# Patient Record
Sex: Male | Born: 1940 | Race: White | Hispanic: No | Smoking: Current every day smoker
Health system: Southern US, Community
[De-identification: ages and names within clinical notes are randomized; demographics above are authoritative.]

## PROBLEM LIST (undated history)

## (undated) DIAGNOSIS — R5381 Other malaise: Secondary | ICD-10-CM

## (undated) DIAGNOSIS — R55 Syncope and collapse: Secondary | ICD-10-CM

## (undated) DIAGNOSIS — E871 Hypo-osmolality and hyponatremia: Secondary | ICD-10-CM

## (undated) DIAGNOSIS — E43 Unspecified severe protein-calorie malnutrition: Secondary | ICD-10-CM

## (undated) DIAGNOSIS — I10 Essential (primary) hypertension: Secondary | ICD-10-CM

## (undated) DIAGNOSIS — J449 Chronic obstructive pulmonary disease, unspecified: Secondary | ICD-10-CM

## (undated) DIAGNOSIS — E7800 Pure hypercholesterolemia, unspecified: Secondary | ICD-10-CM

## (undated) DIAGNOSIS — R600 Localized edema: Secondary | ICD-10-CM

## (undated) DIAGNOSIS — J9611 Chronic respiratory failure with hypoxia: Secondary | ICD-10-CM

## (undated) DIAGNOSIS — F29 Unspecified psychosis not due to a substance or known physiological condition: Secondary | ICD-10-CM

## (undated) DIAGNOSIS — K5900 Constipation, unspecified: Secondary | ICD-10-CM

## (undated) DIAGNOSIS — I719 Aortic aneurysm of unspecified site, without rupture: Secondary | ICD-10-CM

## (undated) DIAGNOSIS — E785 Hyperlipidemia, unspecified: Secondary | ICD-10-CM

## (undated) DIAGNOSIS — I959 Hypotension, unspecified: Secondary | ICD-10-CM

## (undated) DIAGNOSIS — J9620 Acute and chronic respiratory failure, unspecified whether with hypoxia or hypercapnia: Secondary | ICD-10-CM

## (undated) DIAGNOSIS — R109 Unspecified abdominal pain: Secondary | ICD-10-CM

## (undated) DIAGNOSIS — I2781 Cor pulmonale (chronic): Secondary | ICD-10-CM

## (undated) HISTORY — DX: Chronic obstructive pulmonary disease, unspecified: J44.9

## (undated) HISTORY — DX: Constipation, unspecified: K59.00

## (undated) HISTORY — DX: Hyperlipidemia, unspecified: E78.5

## (undated) HISTORY — DX: Essential (primary) hypertension: I10

## (undated) HISTORY — DX: Unspecified abdominal pain: R10.9

## (undated) HISTORY — DX: Cor pulmonale (chronic): I27.81

## (undated) HISTORY — DX: Hypo-osmolality and hyponatremia: E87.1

## (undated) HISTORY — DX: Acute and chronic respiratory failure, unspecified whether with hypoxia or hypercapnia: J96.20

## (undated) HISTORY — DX: Localized edema: R60.0

## (undated) HISTORY — DX: Other malaise: R53.81

## (undated) HISTORY — DX: Hypotension, unspecified: I95.9

## (undated) HISTORY — PX: ANGIOPLASTY: SHX39

## (undated) HISTORY — DX: Unspecified psychosis not due to a substance or known physiological condition: F29

## (undated) HISTORY — DX: Syncope and collapse: R55

## (undated) HISTORY — DX: Unspecified severe protein-calorie malnutrition: E43

---

## 2001-10-08 ENCOUNTER — Inpatient Hospital Stay (HOSPITAL_COMMUNITY): Admission: EM | Admit: 2001-10-08 | Discharge: 2001-10-10 | Payer: Self-pay | Admitting: *Deleted

## 2001-10-09 ENCOUNTER — Encounter: Payer: Self-pay | Admitting: Sports Medicine

## 2002-12-08 ENCOUNTER — Emergency Department (HOSPITAL_COMMUNITY): Admission: EM | Admit: 2002-12-08 | Discharge: 2002-12-08 | Payer: Self-pay | Admitting: Emergency Medicine

## 2003-06-09 ENCOUNTER — Emergency Department (HOSPITAL_COMMUNITY): Admission: EM | Admit: 2003-06-09 | Discharge: 2003-06-10 | Payer: Self-pay | Admitting: *Deleted

## 2003-06-10 ENCOUNTER — Encounter: Payer: Self-pay | Admitting: *Deleted

## 2012-08-20 ENCOUNTER — Ambulatory Visit (INDEPENDENT_AMBULATORY_CARE_PROVIDER_SITE_OTHER): Payer: Medicare Other | Admitting: Family Medicine

## 2012-08-20 VITALS — BP 160/97 | HR 92 | Temp 97.5°F | Resp 18 | Ht 70.0 in | Wt 159.8 lb

## 2012-08-20 DIAGNOSIS — R55 Syncope and collapse: Secondary | ICD-10-CM

## 2012-08-20 LAB — POCT CBC
Granulocyte percent: 67.2 %G (ref 37–80)
HCT, POC: 48.5 % (ref 43.5–53.7)
Hemoglobin: 14.7 g/dL (ref 14.1–18.1)
Lymph, poc: 2.3 (ref 0.6–3.4)
MCH, POC: 30.6 pg (ref 27–31.2)
MCHC: 30.3 g/dL — AB (ref 31.8–35.4)
MCV: 101 fL — AB (ref 80–97)
MID (cbc): 0.5 (ref 0–0.9)
MPV: 8.3 fL (ref 0–99.8)
POC Granulocyte: 5.7 (ref 2–6.9)
POC LYMPH PERCENT: 26.6 %L (ref 10–50)
POC MID %: 6.2 %M (ref 0–12)
Platelet Count, POC: 436 10*3/uL — AB (ref 142–424)
RBC: 4.8 M/uL (ref 4.69–6.13)
RDW, POC: 13.7 %
WBC: 8.5 10*3/uL (ref 4.6–10.2)

## 2012-08-20 LAB — GLUCOSE, POCT (MANUAL RESULT ENTRY): POC Glucose: 93 mg/dl (ref 70–99)

## 2012-08-20 NOTE — Progress Notes (Signed)
This 71 year old veteran with acute syncope about one hour prior to arrival. He's lying on his couch when he felt flushing in his face which was quite intense for he passed out. He feels that he was only out for a few seconds and when he woke up he was very jittery. In no distress at present.  There were no witness to this event.  He has not have a sore or lacerated tongue, no medications at present, no headache today, no weakness. He had a similar episode 6 years ago which was worked up over 3 days at the [facility] in no etiology was uncovered. He's a heavy smoker and has a daily cough with this. He has no chest pain, diaphoresis, nausea, vomiting, diarrhea, shortness of breath.  Patient denies any alcohol intake for over year. Is not using any other medications or drugs at this time.  Objective: Patient is alert. Patient is edentulous and shows no signs of obstructive phenomenon in the oropharynx Cranial nerves III through XII are intact Motor exam shows equal movement of all 4 extremities and no tremor Patient is alert, cooperative, unable to describe his history without difficulty Heart: Regular no murmur Neck: Supple, no adenopathy, no bruits Skin: Some actinic changes on the face otherwise unremarkable Chest: Few rhonchi bilaterally, breath sounds focal signs Abdomen: Soft nontender without HSM  EKG shows no acute changes Results for orders placed in visit on 08/20/12  POCT CBC      Component Value Range   WBC 8.5  4.6 - 10.2 K/uL   Lymph, poc 2.3  0.6 - 3.4   POC LYMPH PERCENT 26.6  10 - 50 %L   MID (cbc) 0.5  0 - 0.9   POC MID % 6.2  0 - 12 %M   POC Granulocyte 5.7  2 - 6.9   Granulocyte percent 67.2  37 - 80 %G   RBC 4.80  4.69 - 6.13 M/uL   Hemoglobin 14.7  14.1 - 18.1 g/dL   HCT, POC 16.1  09.6 - 53.7 %   MCV 101.0 (*) 80 - 97 fL   MCH, POC 30.6  27 - 31.2 pg   MCHC 30.3 (*) 31.8 - 35.4 g/dL   RDW, POC 04.5     Platelet Count, POC 436 (*) 142 - 424 K/uL   MPV  8.3  0 - 99.8 fL  GLUCOSE, POCT (MANUAL RESULT ENTRY)      Component Value Range   POC Glucose 93  70 - 99 mg/dl   Assessment: This patient's episode is most typical of a vagal syndrome. I have no reason to suspect that this brief episode we'll return any time soon, although I am concerned about not having etiology.  Plan: Cardiac referral to Dr. Newt Lukes  Discussed findings with patient and because he feels fine now I released him to be followed by cardiology

## 2012-08-20 NOTE — Patient Instructions (Addendum)
Syncope Syncope is a fainting spell. This means the person loses consciousness and drops to the ground. The person is generally unconscious for less than 5 minutes. The person may have some muscle twitches for up to 15 seconds before waking up and returning to normal. Syncope occurs more often in elderly people, but it can happen to anyone. While most causes of syncope are not dangerous, syncope can be a sign of a serious medical problem. It is important to seek medical care.  CAUSES  Syncope is caused by a sudden decrease in blood flow to the brain. The specific cause is often not determined. Factors that can trigger syncope include:  Taking medicines that lower blood pressure.  Sudden changes in posture, such as standing up suddenly.  Taking more medicine than prescribed.  Standing in one place for too long.  Seizure disorders.  Dehydration and excessive exposure to heat.  Low blood sugar (hypoglycemia).  Straining to have a bowel movement.  Heart disease, irregular heartbeat, or other circulatory problems.  Fear, emotional distress, seeing blood, or severe pain. SYMPTOMS  Right before fainting, you may:  Feel dizzy or lightheaded.  Feel nauseous.  See all white or all black in your field of vision.  Have cold, clammy skin. DIAGNOSIS  Your caregiver will ask about your symptoms, perform a physical exam, and perform electrocardiography (ECG) to record the electrical activity of your heart. Your caregiver may also perform other heart or blood tests to determine the cause of your syncope. TREATMENT  In most cases, no treatment is needed. Depending on the cause of your syncope, your caregiver may recommend changing or stopping some of your medicines. HOME CARE INSTRUCTIONS  Have someone stay with you until you feel stable.  Do not drive, operate machinery, or play sports until your caregiver says it is okay.  Keep all follow-up appointments as directed by your  caregiver.  Lie down right away if you start feeling like you might faint. Breathe deeply and steadily. Wait until all the symptoms have passed.  Drink enough fluids to keep your urine clear or pale yellow.  If you are taking blood pressure or heart medicine, get up slowly, taking several minutes to sit and then stand. This can reduce dizziness. SEEK IMMEDIATE MEDICAL CARE IF:   You have a severe headache.  You have unusual pain in the chest, abdomen, or back.  You are bleeding from the mouth or rectum, or you have black or tarry stool.  You have an irregular or very fast heartbeat.  You have pain with breathing.  You have repeated fainting or seizure-like jerking during an episode.  You faint when sitting or lying down.  You have confusion.  You have difficulty walking.  You have severe weakness.  You have vision problems. If you fainted, call your local emergency services (911 in [place] Do not drive yourself to the hospital.  MAKE SURE YOU:  Understand these instructions.  Will watch your condition.  Will get help right away if you are not doing well or get worse. Document Released: 10/03/2005 Document Revised: 04/03/2012 Document Reviewed: 12/02/2011 Exit[employer] Patient Information 2013 [employer].  

## 2012-08-29 DIAGNOSIS — I714 Abdominal aortic aneurysm, without rupture: Secondary | ICD-10-CM | POA: Insufficient documentation

## 2012-09-08 ENCOUNTER — Encounter: Payer: Self-pay | Admitting: Family Medicine

## 2012-09-09 ENCOUNTER — Encounter: Payer: Self-pay | Admitting: Family Medicine

## 2012-09-09 DIAGNOSIS — R55 Syncope and collapse: Secondary | ICD-10-CM | POA: Insufficient documentation

## 2012-09-09 DIAGNOSIS — E785 Hyperlipidemia, unspecified: Secondary | ICD-10-CM

## 2012-09-09 DIAGNOSIS — I10 Essential (primary) hypertension: Secondary | ICD-10-CM | POA: Insufficient documentation

## 2012-09-09 DIAGNOSIS — R935 Abnormal findings on diagnostic imaging of other abdominal regions, including retroperitoneum: Secondary | ICD-10-CM

## 2012-09-09 HISTORY — DX: Hyperlipidemia, unspecified: E78.5

## 2012-09-09 HISTORY — DX: Essential (primary) hypertension: I10

## 2012-09-09 HISTORY — DX: Syncope and collapse: R55

## 2012-10-08 ENCOUNTER — Encounter (HOSPITAL_COMMUNITY): Payer: Self-pay | Admitting: Pharmacy Technician

## 2012-10-23 ENCOUNTER — Ambulatory Visit (HOSPITAL_COMMUNITY)
Admission: RE | Admit: 2012-10-23 | Discharge: 2012-10-23 | Disposition: A | Discharge status: Home / Self Care | Payer: Medicare Other | Source: Ambulatory Visit | Attending: Cardiology | Admitting: Cardiology

## 2012-10-23 ENCOUNTER — Encounter (HOSPITAL_COMMUNITY)
Admission: RE | Disposition: A | Discharge status: Home / Self Care | Payer: Self-pay | Source: Ambulatory Visit | Attending: Cardiology

## 2012-10-23 DIAGNOSIS — I10 Essential (primary) hypertension: Secondary | ICD-10-CM | POA: Insufficient documentation

## 2012-10-23 DIAGNOSIS — J449 Chronic obstructive pulmonary disease, unspecified: Secondary | ICD-10-CM | POA: Insufficient documentation

## 2012-10-23 DIAGNOSIS — J4489 Other specified chronic obstructive pulmonary disease: Secondary | ICD-10-CM | POA: Insufficient documentation

## 2012-10-23 DIAGNOSIS — E785 Hyperlipidemia, unspecified: Secondary | ICD-10-CM | POA: Insufficient documentation

## 2012-10-23 DIAGNOSIS — I2510 Atherosclerotic heart disease of native coronary artery without angina pectoris: Secondary | ICD-10-CM | POA: Insufficient documentation

## 2012-10-23 DIAGNOSIS — F17200 Nicotine dependence, unspecified, uncomplicated: Secondary | ICD-10-CM | POA: Insufficient documentation

## 2012-10-23 DIAGNOSIS — R55 Syncope and collapse: Secondary | ICD-10-CM | POA: Insufficient documentation

## 2012-10-23 HISTORY — PX: ABDOMINAL ANGIOGRAM: SHX5499

## 2012-10-23 HISTORY — PX: LEFT HEART CATHETERIZATION WITH CORONARY ANGIOGRAM: SHX5451

## 2012-10-23 SURGERY — LEFT HEART CATHETERIZATION WITH CORONARY ANGIOGRAM
Anesthesia: LOCAL

## 2012-10-23 MED ORDER — ASPIRIN 81 MG PO CHEW
324.0000 mg | CHEWABLE_TABLET | ORAL | Status: AC
  Administered 2012-10-23: 324 mg via ORAL

## 2012-10-23 MED ORDER — METOPROLOL TARTRATE 25 MG PO TABS
25.0000 mg | ORAL_TABLET | ORAL | Status: DC
  Filled 2012-10-23: qty 1

## 2012-10-23 MED ORDER — MIDAZOLAM HCL 2 MG/2ML IJ SOLN
INTRAMUSCULAR | Status: AC
  Filled 2012-10-23: qty 2

## 2012-10-23 MED ORDER — SODIUM CHLORIDE 0.9 % IV SOLN: 1.0000 mL/kg/h | INTRAVENOUS | Status: DC

## 2012-10-23 MED ORDER — NITROGLYCERIN 0.2 MG/ML ON CALL CATH LAB
INTRAVENOUS | Status: AC
  Filled 2012-10-23: qty 1

## 2012-10-23 MED ORDER — SODIUM CHLORIDE 0.9 % IV SOLN
INTRAVENOUS | Status: DC
  Administered 2012-10-23: 06:00:00 via INTRAVENOUS

## 2012-10-23 MED ORDER — HEPARIN (PORCINE) IN NACL 2-0.9 UNIT/ML-% IJ SOLN
INTRAMUSCULAR | Status: AC
  Filled 2012-10-23: qty 1000

## 2012-10-23 MED ORDER — SODIUM CHLORIDE 0.9 % IV SOLN: 250.0000 mL | INTRAVENOUS | Status: DC | PRN

## 2012-10-23 MED ORDER — ACETAMINOPHEN 325 MG PO TABS: 650.0000 mg | ORAL_TABLET | ORAL | Status: DC | PRN

## 2012-10-23 MED ORDER — SODIUM CHLORIDE 0.9 % IJ SOLN: 3.0000 mL | Freq: Two times a day (BID) | INTRAMUSCULAR | Status: DC

## 2012-10-23 MED ORDER — DILTIAZEM HCL ER COATED BEADS 240 MG PO CP24
240.0000 mg | ORAL_CAPSULE | Freq: Once | ORAL | Status: DC
  Filled 2012-10-23: qty 1

## 2012-10-23 MED ORDER — ASPIRIN 81 MG PO CHEW
CHEWABLE_TABLET | ORAL | Status: AC
  Filled 2012-10-23: qty 4

## 2012-10-23 MED ORDER — HYDROMORPHONE HCL PF 2 MG/ML IJ SOLN
INTRAMUSCULAR | Status: AC
  Filled 2012-10-23: qty 1

## 2012-10-23 MED ORDER — LIDOCAINE HCL (PF) 1 % IJ SOLN
INTRAMUSCULAR | Status: AC
  Filled 2012-10-23: qty 30

## 2012-10-23 MED ORDER — VERAPAMIL HCL 2.5 MG/ML IV SOLN
INTRAVENOUS | Status: AC
  Filled 2012-10-23: qty 2

## 2012-10-23 MED ORDER — ONDANSETRON HCL 4 MG/2ML IJ SOLN: 4.0000 mg | Freq: Four times a day (QID) | INTRAMUSCULAR | Status: DC | PRN

## 2012-10-23 MED ORDER — SODIUM CHLORIDE 0.9 % IJ SOLN: 3.0000 mL | INTRAMUSCULAR | Status: DC | PRN

## 2012-10-23 MED ORDER — BIVALIRUDIN 250 MG IV SOLR
INTRAVENOUS | Status: AC
  Filled 2012-10-23: qty 250

## 2012-10-23 MED ORDER — ADENOSINE 12 MG/4ML IV SOLN
12.0000 mL | INTRAVENOUS | Status: AC
  Administered 2012-10-23: 36 mg via INTRAVENOUS
  Filled 2012-10-23: qty 12

## 2012-10-23 MED ORDER — HEPARIN SODIUM (PORCINE) 1000 UNIT/ML IJ SOLN
INTRAMUSCULAR | Status: AC
  Filled 2012-10-23: qty 1

## 2012-10-23 NOTE — Interval H&P Note (Signed)
History and Physical Interval Note:  10/23/2012 7:45 AM  Colton Owens  has presented today for surgery, with the diagnosis of abnormal stress test  The various methods of treatment have been discussed with the patient and family. After consideration of risks, benefits and other options for treatment, the patient has consented to  Procedure(s) (LRB) with comments: LEFT HEART CATHETERIZATION WITH CORONARY ANGIOGRAM (N/A) ABDOMINAL ANGIOGRAM (N/A) and possible angioplasty. Abdominal aortogram as a surgical intervention .  The patient's history has been reviewed, patient examined, no change in status, stable for surgery.  I have reviewed the patient's chart and labs.  Questions were answered to the patient's satisfaction.     [name]

## 2012-10-23 NOTE — H&P (Signed)
  Please see paper chart  

## 2012-10-23 NOTE — Interval H&P Note (Signed)
History and Physical Interval Note:  10/23/2012 7:49 AM  Colton Owens  has presented today for surgery, with the diagnosis of abnormal stress test  The various methods of treatment have been discussed with the patient and family. After consideration of risks, benefits and other options for treatment, the patient has consented to  Procedure(s) (LRB) with comments: LEFT HEART CATHETERIZATION WITH CORONARY ANGIOGRAM (N/A) ABDOMINAL ANGIOGRAM (N/A) as a surgical intervention .  The patient's history has been reviewed, patient examined, no change in status, stable for surgery.  I have reviewed the patient's chart and labs.  Questions were answered to the patient's satisfaction.     [facility] R  Ms. Dow Adolph, 4th year medical student with me and patient consents for her observation

## 2012-10-23 NOTE — CV Procedure (Addendum)
Procedure performed:  Left heart catheterization including hemodynamic monitoring of the left ventricle, LV gram, selective right and left coronary arteriography. FFR to the RCA, Abdominal and thoracic aortogram.   Indication patient is a 72 year-old male with history of hypertension, syncope, COPD and tobacco use disorder hyperlipidemia,  who presents with syncope. Patient has  had non invasive testing which was abnormal revealing inferior wall ischemia.  Hence is brought to the cardiac catheterization lab to evaluate the  coronary anatomy for definitive diagnosis of CAD. He also had abdominal aortic duplex which had revealed a 3.4 cm distal abdominal aortic aneurysm. Hence aortogram was performed to evaluate the same.   Hemodynamic data:  Left ventricular pressure was 88/5 with LVEDP of 11 mm mercury. Aortic pressure was 83/54 with a mean of 68 mm mercury. There was no pressure gradient across the aortic valve  Left ventricle: Performed in the RAO projection revealed LVEF of 60%. There was No MR. No wall motion abnormality.  Right coronary artery: The vessel is a large and a dominant vessel giving origin to large PL branch and large PDA branch. There is mild diffuse luminal irregularity distally and proximal to mid segment of the right coronary artery showed a calcific luminal irregularity, and there was a 50% stenosis noted in the proximal to mid segment. The lesion length measured at least about 20-24 mm and the distal end of the lesion appeared to have a 50-60% stenosis. AV groove branch came off from the distal right coronary artery. It is  Dominant.  Left main coronary artery is large and normal. Mild calcification is evident.  Circumflex coronary artery: A large vessel giving origin to a large obtuse marginal 1 And 2 small marginals . It is smooth except in the midsegment has mild diffuse luminal irregularity constituting 20-30% stenosis followed by a napkin ringlike 30-40% stenosis. The AV  groove branch is smooth and appear to give a small PDA branch.   LAD:  LAD gives origin to a large diagonal-1.  LAD has mild  luminal irregularities. Proximal LAD has mild calcification and has a 20-30% stenosis diffusely.   Impression: Patient has had outpatient stress testing which had revealed inferior wall ischemia. However the angiographic anatomy of the right coronary artery stenosis does not appear to be high-grade. Hence I felt it was prudent to confirm the significance of the stenosis prior to proceeding with PCI. And with this in mind I proceeded with performing FFR at site of mid RCA stenosis.  FFR baseline 0.96, FFR with take hyperemia 0.86. Hence lesion not felt to be significant.  Thoracic and abdominal aortogram: No evidence of aortic aneurysm. Mild atherosclerotic changes were evident. There were 2 renal arteries one on either sides. Is no evidence of renal artery stenosis. Aortoiliac bifurcation was widely patent. Distal abdominal aorta showed minimal ectasia.  Recommendation: Patient will need aggressive risk modification. Patient's lipids have been well controlled, he will need smoking cessation. He'll be discharged home today with outpatient followup.  Technique: Under sterile precautions using a 6 French right radial  arterial access, a 6 French sheath was introduced into the right radial artery. A 5 [place] Tig 4 catheter was advanced into the ascending aorta selective  right coronary artery and left coronary artery was cannulated and angiography was performed in multiple views. The catheter was pulled back Out of the body over exchange length J-wire.  Same Catheter was used to perform LV gram which was performed in LAO projection.  after having engage the  right coronary artery, using IV Heparin for anticoagulation, I then utilized [employer] wire to perform FFR to the intermediate RCA stenosis. The FFR at this time a 0.96 and it take hyperemia the FFR was 0.86. This lesion was  felt to be hemodynamically insignificant and left alone.  Catheter exchanged out of the body over J-Wire.  Then I utilized a pigtail catheter which was advanced into the thoracic aorta and as the aorta was tortuous, the length of the pigtail was not enough to reach abdominal aorta. I was able to still obtain adequate visualization of the entire abdominal and thoracic aorta and this revealed no evidence of abdominal aortic aneurysm or thoracic aortic aneurysm. Mild atherosclerotic changes were evident. The wire was withdrawn out of the body over the J-wire. Hemostasis was obtained by applying TR band. NO immediate complications noted. Patient tolerated the procedure well.   Rec: Medical therapy with aggressive risk factor reduction.   Disposition: Will be discharged home today with outpatient follow up.

## 2013-02-28 ENCOUNTER — Encounter (HOSPITAL_COMMUNITY): Payer: Self-pay | Admitting: *Deleted

## 2013-02-28 ENCOUNTER — Inpatient Hospital Stay (HOSPITAL_COMMUNITY)
Admission: EM | Admit: 2013-02-28 | Discharge: 2013-03-01 | DRG: 641 | Disposition: A | Discharge status: Home / Self Care | Payer: Medicare Other | Attending: Family Medicine | Admitting: Family Medicine

## 2013-02-28 ENCOUNTER — Emergency Department (HOSPITAL_COMMUNITY): Payer: Medicare Other

## 2013-02-28 DIAGNOSIS — I10 Essential (primary) hypertension: Secondary | ICD-10-CM | POA: Diagnosis present

## 2013-02-28 DIAGNOSIS — R209 Unspecified disturbances of skin sensation: Secondary | ICD-10-CM | POA: Diagnosis present

## 2013-02-28 DIAGNOSIS — E871 Hypo-osmolality and hyponatremia: Principal | ICD-10-CM | POA: Diagnosis present

## 2013-02-28 DIAGNOSIS — Z79899 Other long term (current) drug therapy: Secondary | ICD-10-CM

## 2013-02-28 DIAGNOSIS — I714 Abdominal aortic aneurysm, without rupture, unspecified: Secondary | ICD-10-CM | POA: Diagnosis present

## 2013-02-28 DIAGNOSIS — E878 Other disorders of electrolyte and fluid balance, not elsewhere classified: Secondary | ICD-10-CM | POA: Diagnosis present

## 2013-02-28 DIAGNOSIS — F17200 Nicotine dependence, unspecified, uncomplicated: Secondary | ICD-10-CM | POA: Diagnosis present

## 2013-02-28 DIAGNOSIS — R202 Paresthesia of skin: Secondary | ICD-10-CM

## 2013-02-28 DIAGNOSIS — E785 Hyperlipidemia, unspecified: Secondary | ICD-10-CM | POA: Diagnosis present

## 2013-02-28 DIAGNOSIS — R600 Localized edema: Secondary | ICD-10-CM | POA: Diagnosis present

## 2013-02-28 DIAGNOSIS — R609 Edema, unspecified: Secondary | ICD-10-CM | POA: Diagnosis present

## 2013-02-28 DIAGNOSIS — E7800 Pure hypercholesterolemia, unspecified: Secondary | ICD-10-CM | POA: Diagnosis present

## 2013-02-28 HISTORY — DX: Pure hypercholesterolemia, unspecified: E78.00

## 2013-02-28 HISTORY — DX: Essential (primary) hypertension: I10

## 2013-02-28 LAB — POCT I-STAT TROPONIN I: Troponin i, poc: 0 ng/mL (ref 0.00–0.08)

## 2013-02-28 NOTE — ED Notes (Addendum)
Bilateral feet swelling x 2 days; lt. Arm numbness and some tingling which started today. No cp, no sob. Been having chills tonight. No fevers. Negative for stroke. Non productive for a while.

## 2013-02-28 NOTE — ED Notes (Signed)
The pt returned from xray 

## 2013-02-28 NOTE — ED Notes (Signed)
The pt reports that both his feet and ankles have been swollen for 2 days and he has had some lt arm numbness.  Alert he has a productive sounding cough.  Friend at the bedside

## 2013-03-01 ENCOUNTER — Other Ambulatory Visit: Payer: Self-pay

## 2013-03-01 DIAGNOSIS — R600 Localized edema: Secondary | ICD-10-CM | POA: Diagnosis present

## 2013-03-01 DIAGNOSIS — R209 Unspecified disturbances of skin sensation: Secondary | ICD-10-CM

## 2013-03-01 DIAGNOSIS — E871 Hypo-osmolality and hyponatremia: Principal | ICD-10-CM

## 2013-03-01 HISTORY — DX: Localized edema: R60.0

## 2013-03-01 LAB — COMPREHENSIVE METABOLIC PANEL
Albumin: 3.5 g/dL (ref 3.5–5.2)
BUN: 7 mg/dL (ref 6–23)
Creatinine, Ser: 0.8 mg/dL (ref 0.50–1.35)
GFR calc Af Amer: >90 mL/min (ref >90)
Glucose, Bld: 93 mg/dL (ref 70–99)
Total Protein: 6.9 g/dL (ref 6.0–8.3)

## 2013-03-01 LAB — URINALYSIS, ROUTINE W REFLEX MICROSCOPIC
Bilirubin Urine: NEGATIVE
Hgb urine dipstick: NEGATIVE
Specific Gravity, Urine: 1.009 (ref 1.005–1.030)
Urobilinogen, UA: 0.2 mg/dL (ref 0.0–1.0)
pH: 7 (ref 5.0–8.0)

## 2013-03-01 LAB — CBC
HCT: 41.9 % (ref 39.0–52.0)
HCT: 41 % (ref 39.0–52.0)
Hemoglobin: 14.8 g/dL (ref 13.0–17.0)
Hemoglobin: 14.3 g/dL (ref 13.0–17.0)
MCH: 33.3 pg (ref 26.0–34.0)
MCH: 32.6 pg (ref 26.0–34.0)
MCHC: 35.3 g/dL (ref 30.0–36.0)
MCHC: 34.9 g/dL (ref 30.0–36.0)
MCV: 94.2 fL (ref 78.0–100.0)
RBC: 4.45 MIL/uL (ref 4.22–5.81)

## 2013-03-01 LAB — TROPONIN I: Troponin I: <0.3 ng/mL (ref <0.30)

## 2013-03-01 LAB — BASIC METABOLIC PANEL
BUN: 8 mg/dL (ref 6–23)
CO2: 29 mEq/L (ref 19–32)
Calcium: 9.1 mg/dL (ref 8.4–10.5)
Creatinine, Ser: 0.82 mg/dL (ref 0.50–1.35)
GFR calc non Af Amer: 88 mL/min — ABNORMAL LOW (ref >90)
Glucose, Bld: 92 mg/dL (ref 70–99)

## 2013-03-01 LAB — LIPID PANEL
Cholesterol: 134 mg/dL (ref 0–200)
Total CHOL/HDL Ratio: 2.2 RATIO
Triglycerides: 75 mg/dL (ref <150)
VLDL: 15 mg/dL (ref 0–40)

## 2013-03-01 LAB — ALBUMIN: Albumin: 3.8 g/dL (ref 3.5–5.2)

## 2013-03-01 LAB — OSMOLALITY: Osmolality: 271 mOsm/kg — ABNORMAL LOW (ref 275–300)

## 2013-03-01 MED ORDER — ASPIRIN EC 81 MG PO TBEC
81.0000 mg | DELAYED_RELEASE_TABLET | Freq: Every day | ORAL | Status: DC
  Administered 2013-03-01: 81 mg via ORAL
  Filled 2013-03-01: qty 1

## 2013-03-01 MED ORDER — FUROSEMIDE 10 MG/ML IJ SOLN
20.0000 mg | Freq: Once | INTRAMUSCULAR | Status: AC
  Administered 2013-03-01: 20 mg via INTRAVENOUS
  Filled 2013-03-01: qty 2

## 2013-03-01 MED ORDER — SIMVASTATIN 40 MG PO TABS
40.0000 mg | ORAL_TABLET | Freq: Every day | ORAL | Status: DC
  Filled 2013-03-01: qty 1

## 2013-03-01 MED ORDER — METOPROLOL TARTRATE 25 MG PO TABS
25.0000 mg | ORAL_TABLET | Freq: Two times a day (BID) | ORAL | Status: DC
  Administered 2013-03-01: 25 mg via ORAL
  Filled 2013-03-01 (×2): qty 1

## 2013-03-01 MED ORDER — LORATADINE 10 MG PO TABS
10.0000 mg | ORAL_TABLET | Freq: Every day | ORAL | Status: DC | PRN
  Filled 2013-03-01: qty 1

## 2013-03-01 MED ORDER — HEPARIN SODIUM (PORCINE) 5000 UNIT/ML IJ SOLN
5000.0000 [IU] | Freq: Three times a day (TID) | INTRAMUSCULAR | Status: DC
  Administered 2013-03-01 (×2): 5000 [IU] via SUBCUTANEOUS
  Filled 2013-03-01 (×5): qty 1

## 2013-03-01 MED ORDER — ATORVASTATIN CALCIUM 20 MG PO TABS: 20.0000 mg | ORAL_TABLET | Freq: Every day | ORAL | Status: DC

## 2013-03-01 MED ORDER — NICOTINE 21 MG/24HR TD PT24
21.0000 mg | MEDICATED_PATCH | Freq: Every day | TRANSDERMAL | Status: DC
  Administered 2013-03-01: 21 mg via TRANSDERMAL
  Filled 2013-03-01 (×2): qty 1

## 2013-03-01 MED ORDER — ADULT MULTIVITAMIN W/MINERALS CH
1.0000 | ORAL_TABLET | Freq: Every day | ORAL | Status: DC
  Administered 2013-03-01: 1 via ORAL
  Filled 2013-03-01: qty 1

## 2013-03-01 MED ORDER — ONDANSETRON HCL 4 MG/2ML IJ SOLN: 4.0000 mg | Freq: Four times a day (QID) | INTRAMUSCULAR | Status: DC | PRN

## 2013-03-01 MED ORDER — ACETAMINOPHEN 650 MG RE SUPP: 650.0000 mg | Freq: Four times a day (QID) | RECTAL | Status: DC | PRN

## 2013-03-01 MED ORDER — ATORVASTATIN CALCIUM 20 MG PO TABS
20.0000 mg | ORAL_TABLET | Freq: Every day | ORAL | Status: DC
  Administered 2013-03-01: 20 mg via ORAL
  Filled 2013-03-01: qty 1

## 2013-03-01 MED ORDER — POLYETHYLENE GLYCOL 3350 17 G PO PACK
17.0000 g | PACK | Freq: Every day | ORAL | Status: DC
  Filled 2013-03-01: qty 1

## 2013-03-01 MED ORDER — SENNA 8.6 MG PO TABS
1.0000 | ORAL_TABLET | Freq: Two times a day (BID) | ORAL | Status: DC
  Administered 2013-03-01: 8.6 mg via ORAL
  Filled 2013-03-01 (×2): qty 1

## 2013-03-01 MED ORDER — ONDANSETRON HCL 4 MG PO TABS: 4.0000 mg | ORAL_TABLET | Freq: Four times a day (QID) | ORAL | Status: DC | PRN

## 2013-03-01 MED ORDER — DILTIAZEM HCL ER COATED BEADS 240 MG PO CP24
240.0000 mg | ORAL_CAPSULE | Freq: Every day | ORAL | Status: DC
  Administered 2013-03-01: 240 mg via ORAL
  Filled 2013-03-01: qty 1

## 2013-03-01 MED ORDER — ACETAMINOPHEN 325 MG PO TABS: 650.0000 mg | ORAL_TABLET | Freq: Four times a day (QID) | ORAL | Status: DC | PRN

## 2013-03-01 MED ORDER — SODIUM CHLORIDE 0.9 % IJ SOLN
3.0000 mL | Freq: Two times a day (BID) | INTRAMUSCULAR | Status: DC
  Administered 2013-03-01: 3 mL via INTRAVENOUS

## 2013-03-01 NOTE — Progress Notes (Signed)
UR Completed.  Colton Owens [phone] 03/01/2013  

## 2013-03-01 NOTE — Discharge Summary (Signed)
Physician Discharge Summary  Patient ID: [name] MRN: [id number] DOB/AGE: 18-Dec-1940 72 y.o.  Admit date: 02/28/2013 Discharge date: 03/01/2013  Admission Diagnoses:  Discharge Diagnoses:  Principal Problem:   Bilateral lower extremity edema Active Problems:   Abdominal aneurysm without mention of rupture   Essential hypertension, benign   Other and unspecified hyperlipidemia   Discharged Condition: good  Hospital Course: Colton Owens is 72 y.o. Male with HTN, HLD, AAA and recent cardiac cath that presented to the ED last night with feet swelling and tingling of left arm. Patient lab resulted with hyponatremia (126) hypochloremia (90), that is unknown if acute or chronic, given no previous labs available.   Bilateral ankle swelling: Possible a result of fluid overload. Mild crackles on admission exam, but not impressive in the set of COPD. CXR (COPD-like results) and BNP (99) would not suggest overload. Swelling could be possibly medication related. XLK:GMWNU rhythm with Fusion complexes, Left axis deviation. Trop negative x2. Patient already has follow up with Dr. Nadara Eaton next week.   Hyponatremia hypochloremia: Urine spec: low, suggesting dilution. Membranes are moist. Suspect euvolemic or hypervolemic causes for his hyponatremia. Fluid restriction showed immediate results, and patient was discharged home with a Na of 130. TSH was WNL. Patient's hyponatremia likely from polydipsia, admitted to increase in water intake last 24-48 hours.  Unspecified HLD: Risk stratification labs ordered (results below) . Continued high dose statin therapy  HTN: Continued medications prescribed by Nadara Eaton H/O AAA: Found during cath procedure. Recheck 6 months.  - Moderate fusiform dilation of the abdominal aorta measuring 3cm x 3.2cm is noted in the distal aorta.   Consults: None  Significant Diagnostic Studies:  Dg Chest 2 View  03/01/2013   *RADIOLOGY REPORT*  Clinical Data: Cough, leg  swelling, chills.  CHEST - 2 VIEW  Comparison: None.  Findings: Hyperinflation, emphysematous change, and interstitial coarsening.  Apical scarring.  Increased AP diameter.  No confluent airspace opacity.  No pleural effusion or pneumothorax.  Aortic tortuosity.  Normal heart size. Mild mid thoracic vertebral body height loss is not favored to be acute.  IMPRESSION: COPD.  No acute process identified.   Original Report Authenticated By: Jearld Lesch, M.D.   Lipid Panel     Component Value Date/Time   CHOL 134 03/01/2013 0620   TRIG 75 03/01/2013 0620   HDL 62 03/01/2013 0620   CHOLHDL 2.2 03/01/2013 0620   VLDL 15 03/01/2013 0620   LDLCALC 57 03/01/2013 0620    Treatments: Water restricition  Discharge Exam: Blood pressure 121/74, pulse 79, temperature 98.2 F (36.8 C), temperature source Oral, resp. rate 20, height 5\' 11"  (1.803 m), weight 152 lb 14.4 oz (69.355 kg), SpO2 94.00%. General: NAD. Sitting up in bed. Appears older than age. Oriented x3.  HEENT: AT.Pet[place]. Bilateral eyes without drainage, icterus or injections. Bilateral nares without deformity, patent without drainage. Throat without erythema or exudates. MMM.  Cardiovascular: RRR. No murmur appreciated. No rubs.  Respiratory: Mild rhonchi throughout. Mild crackles at bases. No wheeze. Normal WOB.  Abdomen: Soft. Flat. NTND. BS present  Extremities: No erythema or tenderness. Bilateral ankle/foot swelling with pitting edema 3+. Shins trace edema only.  Skin: Dry, flaky skin. Rash/dry skin on face.  Neuro: PERLA. EOMi. No focal deficits, grossly intact.   Disposition: 01-Home or Self Care     Medication List    TAKE these medications       aspirin EC 81 MG tablet  Take 81 mg by mouth daily.  diltiazem 240 MG 24 hr capsule  Commonly known as:  CARDIZEM CD  Take 240 mg by mouth daily.     metoprolol tartrate 25 MG tablet  Commonly known as:  LOPRESSOR  Take 25 mg by mouth 2 (two) times daily.     multivitamin with  minerals Tabs  Take 1 tablet by mouth daily.     OVER THE COUNTER MEDICATION  Take 1 tablet by mouth daily as needed (allergies). OTC allergy medication     pravastatin 80 MG tablet  Commonly known as:  PRAVACHOL  Take 80 mg by mouth at bedtime.       Follow-up Information   Schedule an appointment as soon as possible for a visit with DAUB, Stan Head, MD.   Contact information:   [address] [place] [address] [phone]       Follow up with [name], MD. (Keep your scheduled appointment )    Contact information:   [address] [place] [address] [phone]      Outpatient recommendations: - Bilateral pedal edema Signed: Felix Pacini 03/01/2013, 12:07 PM

## 2013-03-01 NOTE — ED Notes (Signed)
The pt  Does not have pain anywhere at present talking to his friend at his bedside

## 2013-03-01 NOTE — Progress Notes (Addendum)
Interim progress note  Spoke to pt concerning care. Feeling much better, and states that ankle edema improving. Requesting to go home. Has appt w/ Dr. Jacinto Halim in July but states that was told he can move that up to May 27. WIll Call their office to make this change. Pt will schedule appt to be seen at PCP office next week. Expressed importance of limiting free water intake. Will repeat labs upon presentation to clinic.   Will administer Lasix IV 20mg  x1 prior to DC for additional diuresis for LE edema and hyponatremia  Shelly Flatten, MD Family Medicine PGY-2 03/01/2013, 1:27 PM [phone]

## 2013-03-01 NOTE — ED Notes (Signed)
MD at bedside. 

## 2013-03-01 NOTE — Evaluation (Signed)
Physical Therapy Evaluation Patient Details Name: Colton Owens MRN: [id number] DOB: 1941/04/11 Today's Date: 03/01/2013 Time: 0920-1010    PT Assessment / Plan / Recommendation Clinical Impression  Pts O2 Sats drop with activity.   He has very sedentary lifestyle.  he doesnt walk much.  He still smokes daily.  Pt gets SOB walking to mailbox and taking out trash (he only does this occasionally).  pt needs to have a more active lifestyle.  We talked about the necessity for pt to take 10 short walks a day to help him increse his activity level.  Pt knows that long walks (further than mailbox) are not the way for him to get stronger.  Pt verbalizes understanding and doesnt want any further PT intervention.  he says he knows what to do and it is up to him to do it.  Pt thought that taking a vitamin alone woud help him get stronger/feel better.  We talked about improtance of adding exercise and diet ideas this this to help him feel better.  Education complete.    PT Assessment  Patent does not need any further PT services    Follow Up Recommendations  No PT follow up (per pt request)    Does the patient have the potential to tolerate intense rehabilitation      Barriers to Discharge        Equipment Recommendations  None recommended by PT    Recommendations for Other Services     Frequency      Precautions / Restrictions Precautions Precautions: None   Pertinent Vitals/Pain Pt O2 at rest 94% and after walking 150 feet 90%.  No pain.      Mobility  Bed Mobility Bed Mobility: Supine to Sit Supine to Sit: 7: Independent Transfers Transfers: Sit to Stand Sit to Stand: 7: Independent Ambulation/Gait Ambulation/Gait Assistance: 7: Independent Assistive device: None Ambulation/Gait Assistance Details: Pt with step to gait pattern.  Pt looked side to side and up and down with minimal change in gait pattern.  No obvious loss of balance Gait Pattern: Within Functional Limits     Exercises     PT Diagnosis:    PT Problem List:   PT Treatment Interventions:     PT Goals    Visit Information  Last PT Received On: 03/01/13 Assistance Needed: +1    Subjective Data  Subjective: what do I need PT for? Patient Stated Goal: To get home   Prior Functioning  Home Living Lives With: Alone Available Help at Discharge: Family (daughter checks on pt occasionally) Type of Home: Mobile home Home Access: Stairs to enter (3-4 steps to get in.  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  no increased sway with eyes closed or head turns.  Pt able to lean safely.  Pt did head turns side to side and up and down walking with no observed change in gait  pattern.  End of Session PT - End of Session Activity Tolerance: Patient limited by fatigue Patient left: in bed;with call bell/phone within reach  GP     Judson Roch 03/01/2013, 10:26 AM  Ranae Palms, PT

## 2013-03-01 NOTE — Progress Notes (Signed)
Occupational Therapy Discharge Patient Details Name: Colton Owens MRN: [id number] DOB: [birth year] Today's Date: 03/01/2013 Time:  -     Patient discharged from OT services secondary to pt independent with PT for all mobility and pt states that he is not having any issues with taking care of himself. Noted that he told the PT that retirement was boring so encouraged him to maybe get a part time job or volunteer somewhere. No OT needs identified, will not eval and will sign off..  Please see latest therapy progress note for current level of functioning and progress toward goals.    Progress and discharge plan discussed with patient and/or caregiver: Patient/Caregiver agrees with plan       Evette Georges [phone] 03/01/2013, 2:23 PM

## 2013-03-01 NOTE — H&P (Signed)
Family Medicine Teaching [facility] Admission History and Physical Service Pager: [phone]  Patient name: Colton Owens  Medical record number: [id number]  Date of birth: 06-Oct-1941 Age: 72 y.o.    Primary Care Provider: Lucilla Edin, MD  Chief Complaint: Bilateral leg swelling  History of Present Illness: Colton Owens is a 72 y.o.  Male with HTN, HLD, AAA and recent cardiac cath that presented to the ED last night with 3-4 days of his  feet swelling, mostly around the ankles. He denies andy pain with this his swelling and reports he does not do anything strenuous enough to hurt himself. He also noticed yesterday his left arm started to "tingle" all the way down to the finger tips and has remained numb. He called Dr. Zada Girt office at the start of the swelling and he was given an appointment for the end of this Month. He felt he could no longer wait until then. He endorses increased in fatigue, and heart "flutter." He reports some days he just has no energy and walking to the mailbox, across the street, is difficult and gets winded. He lives alone, and his daughter comes by to visit occassionally. He denies alcohol use and states his last drink was 2-3 years ago. He denies drugs, except for antihistamine sometimes. He has tried to take vitamins to help him feel better, but he does not think they are working. He smokes daily >1 pack a day, for "many years." Also, the patient states that he drank 4 - 20 ounce bottles of water at the urging of a friend.   He denies nausea, vomit, headache, chest pain, shortness of breath, vision changes or diarrhea. Reports he eats daily, but "not well like he should." He admits to constipation.    Patient Active Problem List   Diagnosis Date Noted  . Essential hypertension, benign 09/09/2012  . Other and unspecified hyperlipidemia 09/09/2012  . Syncope and collapse 09/09/2012  . Abdominal aneurysm without mention of rupture 08/29/2012    Past  Medical History  Diagnosis Date  . Hypertension   . High cholesterol     Past Surgical History  Procedure Laterality Date  . Angioplasty      History   Social History  . Marital Status: Divorced    Spouse Name: N/A    Number of Children: N/A  . Years of Education: N/A   Occupational History  . Not on file.   Social History Main Topics  . Smoking status: Current Every Day Smoker -- 1.00 packs/day    Types: Cigarettes  . Smokeless tobacco: Not on file  . Alcohol Use: No  . Drug Use: No  . Sexually Active: Not on file   Other Topics Concern  . Not on file   Social History Narrative  . No narrative on file    No family history on file.  No Known Allergies  Review Of Systems: Per HPI  Otherwise 12 point review of systems was performed and was unremarkable.  Physical Exam: BP 149/89  Pulse 71  Temp(Src) 97.8 F (36.6 C) (Oral)  Resp 24  SpO2 96%  Exam: General: NAD. Sitting up in bed. Appears older than age. Oriented x3.  HEENT: AT.[place]. Bilateral eyes without drainage, icterus or injections. Bilateral nares without deformity, patent without drainage. Throat without erythema or exudates. MMM.  Cardiovascular: RRR. No murmur appreciated. No rubs.  Respiratory: Mild rhonchi throughout. Mild crackles at bases. No wheeze. Normal WOB.  Abdomen: Soft. Flat. NTND. BS present  Extremities: No erythema or tenderness. Bilateral ankle/foot  swelling with pitting edema 3+. Shins trace edema only.  Skin: Dry, flaky skin. Rash/dry skin on face.  Neuro: PERLA. EOMi. No focal deficits, grossly intact.  Labs and Imaging: Dg Chest 2 View  03/01/2013   *RADIOLOGY REPORT*  Clinical Data: Cough, leg swelling, chills.  CHEST - 2 VIEW  Comparison: None.  Findings: Hyperinflation, emphysematous change, and interstitial coarsening.  Apical scarring.  Increased AP diameter.  No confluent airspace opacity.  No pleural effusion or pneumothorax.  Aortic tortuosity.  Normal heart size. Mild  mid thoracic vertebral body height loss is not favored to be acute.  IMPRESSION: COPD.  No acute process identified.   Original Report Authenticated By: Jearld Lesch, M.D.    Results for orders placed during the hospital encounter of 02/28/13 (from the past 24 hour(s))  CBC     Status: None   Collection Time    02/28/13 11:11 PM      Result Value Range   WBC 10.1  4.0 - 10.5 K/uL   RBC 4.45  4.22 - 5.81 MIL/uL   Hemoglobin 14.8  13.0 - 17.0 g/dL   HCT 62.9  52.8 - 41.3 %   MCV 94.2  78.0 - 100.0 fL   MCH 33.3  26.0 - 34.0 pg   MCHC 35.3  30.0 - 36.0 g/dL   RDW 24.4  01.0 - 27.2 %   Platelets 388  150 - 400 K/uL  BASIC METABOLIC PANEL     Status: Abnormal   Collection Time    02/28/13 11:11 PM      Result Value Range   Sodium 126 (*) 135 - 145 mEq/L   Potassium 4.1  3.5 - 5.1 mEq/L   Chloride 90 (*) 96 - 112 mEq/L   CO2 29  19 - 32 mEq/L   Glucose, Bld 92  70 - 99 mg/dL   BUN 8  6 - 23 mg/dL   Creatinine, Ser 5.36  0.50 - 1.35 mg/dL   Calcium 9.1  8.4 - 64.4 mg/dL   GFR calc non Af Amer 88 (*) >90 mL/min   GFR calc Af Amer >90  >90 mL/min  PRO B NATRIURETIC PEPTIDE     Status: None   Collection Time    02/28/13 11:11 PM      Result Value Range   Pro B Natriuretic peptide (BNP) 99.6  0 - 125 pg/mL  POCT I-STAT TROPONIN I     Status: None   Collection Time    02/28/13 11:39 PM      Result Value Range   Troponin i, poc 0.00  0.00 - 0.08 ng/mL   Comment 3           ALBUMIN     Status: None   Collection Time    03/01/13  1:59 AM      Result Value Range   Albumin 3.8  3.5 - 5.2 g/dL  URINALYSIS, ROUTINE W REFLEX MICROSCOPIC     Status: None   Collection Time    03/01/13  2:31 AM      Result Value Range   Color, Urine YELLOW  YELLOW   APPearance CLEAR  CLEAR   Specific Gravity, Urine 1.009  1.005 - 1.030   pH 7.0  5.0 - 8.0   Glucose, UA NEGATIVE  NEGATIVE mg/dL   Hgb urine dipstick NEGATIVE  NEGATIVE   Bilirubin Urine NEGATIVE  NEGATIVE   Ketones, ur NEGATIVE  NEGATIVE mg/dL   Protein, ur NEGATIVE  NEGATIVE mg/dL   Urobilinogen, UA 0.2  0.0 - 1.0 mg/dL   Nitrite NEGATIVE  NEGATIVE   Leukocytes, UA NEGATIVE  NEGATIVE     Assessment and Plan: Colton Owens is 72 y.o.  Male with HTN, HLD, AAA and recent cardiac cath that presented to the ED last night with feet swelling and tingling of left arm. Patient lab resulted with hyponatremia (126) hypochloremia (90), that is unknown if acute or chronic, given no previous labs available.   Bilateral ankle swelling: Possible a result of fluid overload. Mild crackles on exam, but not impressive in the set of COPD. CXR (COPD) and BNP (99) would not suggest overload. Possibly medication related. Will  rule out CHF. ZOX:WRUEA rhythm with Fusion complexes, Left axis deviation - repeat EKG ordered for this morning - Echo ordered - Ted hose - trop negative x1, cycled ordered - Will let [facility] office know he is here in the morning.  Hyponatremia hypochloremia: Urine spec: low, suggesting dilution. Membranes are moist. Suspect euvolemic or hypervolemic causes. - TSH, serum/urine osmo, serum/urine creatinine - Fluid restrict 1L/d  Unspecified HLD: Risk stratification labs ordered.  - Continued high dose statin therapy  HTN: Continued medications prescribed by [facility] for now.   H/O AAA: Found during cath procedure. Recheck 6 months.  - Moderate fusiform dilation of the abdominal aorta measuring 3cm x 3.2cm is noted in the distal aorta.    Patient Active Problem List   Diagnosis Date Noted  . Essential hypertension, benign 09/09/2012  . Other and unspecified hyperlipidemia 09/09/2012  . Syncope and collapse 09/09/2012  . Abdominal aneurysm without mention of rupture 08/29/2012    FEN/GI: Fluid restriction (1L) heart healthy  Prophylaxis: hep sq Disposition: Pending clinic improvement and PT recs Code Status: Full  Felix Pacini DO    Family Medicine Resident PGY-1 03/01/2013, 3:41 AM  I have seen  the patient and agree with the assessment and plan.   Si Raider Clinton Sawyer, MD, MBA 03/01/2013, 7:59 AM Family Medicine Resident, PGY-2 [phone] pager   FMTS Attending Admit Note Patient seen and examined, discussed with Dr Clinton Sawyer and I agree with plan as outlined above in Dr Alan Ripper note. Briefly, a [phone] who presented with complaint of bilateral foot edema over the past 3 to 4 days; some tingling/numbness in his L hand up to around the elbow (denies pain or motor weakness/lack of coordination).  No other motor deficits, no dysarthria or visual disturbances.  His workup in the ED reveals a serum Na+=126.  He does not drink alcohol (states he has not drunk in 2 years), but admits to marked increase in water intake (says he had four 20-oz bottles of water 2 days ago-- prompted by his sister who commented that he needed to drink more water).  He does not have a known history of CHF or liver disease, and his renal function is unremarkable on admission.  He does not take diuretics for his hypertension.  On exam he is pleasant, oriented to time and place, good historian.  He has moist mucus membranes and no JVD.  Heart sounds regular; clear lung fields on my exam.  No organomegaly.  LEs with trace posterior lateral malleolar edema symmetrically but relatively unimpressive. His handgrip and gross sensation in hands/UEs is symmetric and full.  He has no facial droop or ptosis.   Assess/Plan: Patient with hyponatremia that I believe is due to polydipsia, in absence of renal or liver  disease, or CHF.  We are awaiting results of Serum Osm and Urine Osm/Na/Cr.  Almost certainly he will have low serum Osm; given the dilute nature of urine (spec grav 1.009) I suspect low Urine Osm as well. For this I would fluid restrict and monitor sodium closely.  May consider loop diuretic to increase water excretion if not improving.  No old labs in chart, but he may have had these done at Dr Verl Dicker office, therefore  would call and find out what previous chem panels show.  May want to do orthostatic BPs now as well.  Patient lives alone; PT/OT eval in light of this and his potential for falls at home (he denies prior falls to date).  Paula Compton, MD

## 2013-03-01 NOTE — Progress Notes (Signed)
All d/c instructions explained and given to pt.  Verbalized understanding.  D/c off the floor via w/c by NT. To awaiting transport.  Amanda Pea, Charity fundraiser.

## 2013-03-01 NOTE — Progress Notes (Signed)
Colton Owens 72 y.o.  Interim note:  Patient feels about the same this morning. Sodium has started to correct with fluid restriction alone. Awaiting lab work results and PT eval.

## 2013-03-01 NOTE — ED Notes (Signed)
Blanket offered not cold now.  Friend at the bedside

## 2013-03-01 NOTE — ED Provider Notes (Addendum)
History     CSN: [id number]  Arrival date & time 02/28/13  2247   First MD Initiated Contact with Patient 02/28/13 2308      Chief Complaint  Patient presents with  . Numbness  . Cough  . Leg Swelling  . Chills    (Consider location/radiation/quality/duration/timing/severity/associated sxs/prior treatment) HPI 72 year old male presents to the emergency department complaining of two-day history of bilateral foot swelling.  He denies any pain, associated with the swelling.  He has noted some pins and needle sensation to his left upper arm.  Today as well.  He denies any chest pain, no shortness of breath.  He denies previous history of congestive heart failure.  He reports he had a negative.  Heart catheterization by Dr. Jacinto Owens in January. Patient has had some cold chills, tonight, but no fevers.  He denies any other symptoms at this time.  Patient reports he has been elevating his feet since they have been swollen without improvement.  Past Medical History  Diagnosis Date  . Hypertension   . High cholesterol     Past Surgical History  Procedure Laterality Date  . Angioplasty      No family history on file.  History  Substance Use Topics  . Smoking status: Current Every Day Smoker -- 1.00 packs/day    Types: Cigarettes  . Smokeless tobacco: Not on file  . Alcohol Use: No      Review of Systems  All other systems reviewed and are negative.   Other than listed in history of present illness   Allergies  Review of patient's allergies indicates no known allergies.  Home Medications   Current Outpatient Rx  Name  Route  Sig  Dispense  Refill  . aspirin EC 81 MG tablet   Oral   Take 81 mg by mouth daily.         Marland Kitchen diltiazem (CARDIZEM CD) 240 MG 24 hr capsule   Oral   Take 240 mg by mouth daily.         . metoprolol tartrate (LOPRESSOR) 25 MG tablet   Oral   Take 25 mg by mouth 2 (two) times daily.         . Multiple Vitamin (MULTIVITAMIN WITH  MINERALS) TABS   Oral   Take 1 tablet by mouth daily.         Marland Kitchen OVER THE COUNTER MEDICATION   Oral   Take 1 tablet by mouth daily as needed (allergies). OTC allergy medication         . pravastatin (PRAVACHOL) 80 MG tablet   Oral   Take 80 mg by mouth at bedtime.           BP 152/84  Pulse 66  Temp(Src) 97.8 F (36.6 C) (Oral)  Resp 18  SpO2 98%  Physical Exam  Nursing note and vitals reviewed. Constitutional: He is oriented to person, place, and time. He appears well-developed and well-nourished. No distress.  HENT:  Head: Normocephalic and atraumatic.  Nose: Nose normal.  Mouth/Throat: Oropharynx is clear and moist.  Eyes: Conjunctivae and EOM are normal. Pupils are equal, round, and reactive to light.  Neck: Normal range of motion. Neck supple. No JVD present. No tracheal deviation present. No thyromegaly present.  Cardiovascular: Normal rate, regular rhythm, normal heart sounds and intact distal pulses.  Exam reveals no gallop and no friction rub.   No murmur heard. Pulmonary/Chest: Effort normal and breath sounds normal. No stridor. No respiratory distress. He has  no wheezes. He has no rales. He exhibits no tenderness.  Abdominal: Soft. Bowel sounds are normal. He exhibits no distension and no mass. There is no tenderness. There is no rebound and no guarding.  Musculoskeletal: Normal range of motion. He exhibits edema (2+ pitting edema to lateral feet bilaterally). He exhibits no tenderness.  Lymphadenopathy:    He has no cervical adenopathy.  Neurological: He is alert and oriented to person, place, and time. He has normal reflexes. No cranial nerve deficit. He exhibits normal muscle tone. Coordination normal.  Skin: Skin is warm and dry. No rash noted. No erythema. No pallor.  Psychiatric: He has a normal mood and affect. His behavior is normal. Judgment and thought content normal.    ED Course  Procedures (including critical care time)  Labs Reviewed  BASIC  METABOLIC PANEL - Abnormal; Notable for the following:    Sodium 126 (*)    Chloride 90 (*)    GFR calc non Af Amer 88 (*)    All other components within normal limits  CBC  PRO B NATRIURETIC PEPTIDE  URINALYSIS, ROUTINE W REFLEX MICROSCOPIC  ALBUMIN  POCT I-STAT TROPONIN I   Dg Chest 2 View  03/01/2013   *RADIOLOGY REPORT*  Clinical Data: Cough, leg swelling, chills.  CHEST - 2 VIEW  Comparison: None.  Findings: Hyperinflation, emphysematous change, and interstitial coarsening.  Apical scarring.  Increased AP diameter.  No confluent airspace opacity.  No pleural effusion or pneumothorax.  Aortic tortuosity.  Normal heart size. Mild mid thoracic vertebral body height loss is not favored to be acute.  IMPRESSION: COPD.  No acute process identified.   Original Report Authenticated By: Colton Owens, M.D.     Date: 03/01/2013  Rate: 62  Rhythm: normal sinus rhythm and premature ventricular contractions (PVC)  QRS Axis: left  Intervals: normal  ST/T Wave abnormalities: twave inversion in lead v1  Conduction Disutrbances:none  Narrative Interpretation:   Old EKG Reviewed: changes noted    1. Hyponatremia   2. Peripheral edema   3. Arm paresthesia, left       MDM  72 year old male with left arm paresthesia and bilateral lower foot, edema.  Patient well-appearing on exam.  We'll check chest x-ray, BNP, troponin, CBC.  EKG shows new flipped T wave otherwise, unremarkable.      2:09 AM Patient noted to have significant hyponatremia.  Albumin, and urine added on to prior.  Labs.  No prior sodiums to compare to.  Patient had heart catheterization done with Dr. Garth Owens. in January, I asked him that he needed.  Chemistries done, most likely in the office.  Those are not available at this time.  Given that patient lives alone, is 72 years old, and has hyponatremia, will discuss with family practice for admission, and further workup of his hyponatremia.  Patient denies drinking alcohol,  and his friend confirms this.  I suspect patient is overall hypervolemic, though not in congestive heart failure.  Colton Mackie, MD 03/01/13 1610  Colton Mackie, MD 03/01/13 [phone]

## 2013-03-01 NOTE — Progress Notes (Signed)
Nutrition Brief Note  Patient identified on the Malnutrition Screening Tool (MST) Report for unsure of any recent weight loss. Per review of usual weights in EMR, patient has not lost a significant amount of weight.  Wt Readings from Last 10 Encounters:  03/01/13 152 lb 14.4 oz (69.355 kg)  10/23/12 160 lb (72.576 kg)  10/23/12 160 lb (72.576 kg)  08/20/12 159 lb 12.8 oz (72.485 kg)     Body mass index is 21.33 kg/(m^2). Patient meets criteria for normal weight based on current BMI.   Current diet order is Heart Healthy, patient is consuming approximately 100% of meals at this time. Labs and medications reviewed.   No nutrition interventions warranted at this time. If nutrition issues arise, please consult RD.   Joaquin Courts, RD, LDN, CNSC Pager [phone] After Hours Pager [phone]

## 2013-03-07 NOTE — Discharge Summary (Signed)
Family Medicine Teaching Service  Discharge Note : Attending Jeff Walden MD Pager [phone] Inpatient Team Pager:  [phone]  I have reviewed this patient and the patient's chart and have discussed discharge planning with the resident at the time of discharge. I agree with the discharge plan as above.    

## 2013-07-15 ENCOUNTER — Encounter: Payer: Self-pay | Admitting: Family Medicine

## 2014-01-01 ENCOUNTER — Encounter: Payer: Self-pay | Admitting: Family Medicine

## 2014-09-22 ENCOUNTER — Ambulatory Visit
Admission: RE | Admit: 2014-09-22 | Discharge: 2014-09-22 | Disposition: A | Discharge status: Home / Self Care | Payer: Medicare Other | Source: Ambulatory Visit | Attending: Cardiology | Admitting: Cardiology

## 2014-09-22 ENCOUNTER — Other Ambulatory Visit: Payer: Self-pay | Admitting: Cardiology

## 2014-09-22 ENCOUNTER — Encounter (INDEPENDENT_AMBULATORY_CARE_PROVIDER_SITE_OTHER): Payer: Self-pay

## 2014-09-22 DIAGNOSIS — R0602 Shortness of breath: Secondary | ICD-10-CM

## 2014-09-25 ENCOUNTER — Encounter (HOSPITAL_COMMUNITY): Payer: Self-pay | Admitting: Cardiology

## 2014-10-27 ENCOUNTER — Institutional Professional Consult (permissible substitution): Payer: Medicare Other | Admitting: Pulmonary Disease

## 2014-11-26 ENCOUNTER — Institutional Professional Consult (permissible substitution): Payer: Medicare Other | Admitting: Pulmonary Disease

## 2014-12-26 ENCOUNTER — Encounter: Payer: Self-pay | Admitting: Gastroenterology

## 2015-01-08 ENCOUNTER — Institutional Professional Consult (permissible substitution): Payer: Medicare Other | Admitting: Pulmonary Disease

## 2015-02-16 ENCOUNTER — Ambulatory Visit: Payer: Medicare Other | Admitting: Gastroenterology

## 2015-02-28 ENCOUNTER — Inpatient Hospital Stay (HOSPITAL_COMMUNITY)
Admission: EM | Admit: 2015-02-28 | Discharge: 2015-03-01 | DRG: 641 | Disposition: A | Discharge status: Home / Self Care | Payer: Medicare Other | Attending: Internal Medicine | Admitting: Internal Medicine

## 2015-02-28 ENCOUNTER — Emergency Department (HOSPITAL_COMMUNITY): Payer: Medicare Other

## 2015-02-28 ENCOUNTER — Encounter (HOSPITAL_COMMUNITY): Payer: Self-pay

## 2015-02-28 DIAGNOSIS — I714 Abdominal aortic aneurysm, without rupture, unspecified: Secondary | ICD-10-CM

## 2015-02-28 DIAGNOSIS — E871 Hypo-osmolality and hyponatremia: Secondary | ICD-10-CM

## 2015-02-28 DIAGNOSIS — J9611 Chronic respiratory failure with hypoxia: Secondary | ICD-10-CM | POA: Diagnosis present

## 2015-02-28 DIAGNOSIS — J449 Chronic obstructive pulmonary disease, unspecified: Secondary | ICD-10-CM | POA: Diagnosis not present

## 2015-02-28 DIAGNOSIS — R10A3 Flank pain, bilateral: Secondary | ICD-10-CM

## 2015-02-28 DIAGNOSIS — E878 Other disorders of electrolyte and fluid balance, not elsewhere classified: Secondary | ICD-10-CM | POA: Diagnosis present

## 2015-02-28 DIAGNOSIS — F17210 Nicotine dependence, cigarettes, uncomplicated: Secondary | ICD-10-CM | POA: Diagnosis present

## 2015-02-28 DIAGNOSIS — R109 Unspecified abdominal pain: Secondary | ICD-10-CM

## 2015-02-28 DIAGNOSIS — R600 Localized edema: Secondary | ICD-10-CM | POA: Diagnosis present

## 2015-02-28 DIAGNOSIS — R10A0 Flank pain, unspecified side: Secondary | ICD-10-CM

## 2015-02-28 DIAGNOSIS — R5381 Other malaise: Secondary | ICD-10-CM | POA: Diagnosis present

## 2015-02-28 DIAGNOSIS — Z7982 Long term (current) use of aspirin: Secondary | ICD-10-CM | POA: Diagnosis not present

## 2015-02-28 DIAGNOSIS — I10 Essential (primary) hypertension: Secondary | ICD-10-CM | POA: Diagnosis not present

## 2015-02-28 DIAGNOSIS — R52 Pain, unspecified: Secondary | ICD-10-CM

## 2015-02-28 DIAGNOSIS — I2781 Cor pulmonale (chronic): Secondary | ICD-10-CM | POA: Diagnosis present

## 2015-02-28 DIAGNOSIS — E780 Pure hypercholesterolemia: Secondary | ICD-10-CM | POA: Diagnosis not present

## 2015-02-28 DIAGNOSIS — I7100 Dissection of unspecified site of aorta: Secondary | ICD-10-CM

## 2015-02-28 DIAGNOSIS — M549 Dorsalgia, unspecified: Secondary | ICD-10-CM | POA: Diagnosis present

## 2015-02-28 HISTORY — DX: Hypo-osmolality and hyponatremia: E87.1

## 2015-02-28 HISTORY — DX: Chronic obstructive pulmonary disease, unspecified: J44.9

## 2015-02-28 HISTORY — DX: Aortic aneurysm of unspecified site, without rupture: I71.9

## 2015-02-28 HISTORY — DX: Chronic respiratory failure with hypoxia: J96.11

## 2015-02-28 LAB — COMPREHENSIVE METABOLIC PANEL
ALBUMIN: 3.2 g/dL — AB (ref 3.5–5.0)
ALK PHOS: 61 U/L (ref 38–126)
ALT: 13 U/L — AB (ref 17–63)
ANION GAP: 9 (ref 5–15)
AST: 24 U/L (ref 15–41)
BUN: 6 mg/dL (ref 6–20)
CHLORIDE: 86 mmol/L — AB (ref 101–111)
CO2: 29 mmol/L (ref 22–32)
Calcium: 8.6 mg/dL — ABNORMAL LOW (ref 8.9–10.3)
Creatinine, Ser: 0.76 mg/dL (ref 0.61–1.24)
GFR calc Af Amer: >60 mL/min (ref >60)
GFR calc non Af Amer: >60 mL/min (ref >60)
GLUCOSE: 97 mg/dL (ref 65–99)
Potassium: 4.4 mmol/L (ref 3.5–5.1)
SODIUM: 124 mmol/L — AB (ref 135–145)
Total Bilirubin: 0.8 mg/dL (ref 0.3–1.2)
Total Protein: 6.1 g/dL — ABNORMAL LOW (ref 6.5–8.1)

## 2015-02-28 LAB — CBC WITH DIFFERENTIAL/PLATELET
BASOS PCT: 0 % (ref 0–1)
Basophils Absolute: 0 10*3/uL (ref 0.0–0.1)
EOS ABS: 0 10*3/uL (ref 0.0–0.7)
EOS PCT: 0 % (ref 0–5)
HCT: 43.3 % (ref 39.0–52.0)
HEMOGLOBIN: 14.5 g/dL (ref 13.0–17.0)
LYMPHS PCT: 12 % (ref 12–46)
Lymphs Abs: 1.1 10*3/uL (ref 0.7–4.0)
MCH: 32.4 pg (ref 26.0–34.0)
MCHC: 33.5 g/dL (ref 30.0–36.0)
MCV: 96.7 fL (ref 78.0–100.0)
Monocytes Absolute: 0.8 10*3/uL (ref 0.1–1.0)
Monocytes Relative: 8 % (ref 3–12)
NEUTROS ABS: 7.5 10*3/uL (ref 1.7–7.7)
NEUTROS PCT: 79 % — AB (ref 43–77)
Platelets: 322 10*3/uL (ref 150–400)
RBC: 4.48 MIL/uL (ref 4.22–5.81)
RDW: 11.7 % (ref 11.5–15.5)
WBC: 9.5 10*3/uL (ref 4.0–10.5)

## 2015-02-28 LAB — URINALYSIS, ROUTINE W REFLEX MICROSCOPIC
Bilirubin Urine: NEGATIVE
Glucose, UA: NEGATIVE mg/dL
Hgb urine dipstick: NEGATIVE
KETONES UR: 15 mg/dL — AB
Leukocytes, UA: NEGATIVE
Nitrite: NEGATIVE
PH: 7 (ref 5.0–8.0)
Protein, ur: NEGATIVE mg/dL
Specific Gravity, Urine: 1.011 (ref 1.005–1.030)
UROBILINOGEN UA: 0.2 mg/dL (ref 0.0–1.0)

## 2015-02-28 LAB — CBC
HCT: 42.7 % (ref 39.0–52.0)
Hemoglobin: 14.5 g/dL (ref 13.0–17.0)
MCH: 32.7 pg (ref 26.0–34.0)
MCHC: 34 g/dL (ref 30.0–36.0)
MCV: 96.4 fL (ref 78.0–100.0)
Platelets: 279 10*3/uL (ref 150–400)
RBC: 4.43 MIL/uL (ref 4.22–5.81)
RDW: 11.6 % (ref 11.5–15.5)
WBC: 7.4 10*3/uL (ref 4.0–10.5)

## 2015-02-28 LAB — TSH
TSH: 1.426 u[IU]/mL (ref 0.350–4.500)
TSH: 1.356 u[IU]/mL (ref 0.350–4.500)

## 2015-02-28 LAB — OSMOLALITY: OSMOLALITY: 265 mosm/kg — AB (ref 275–300)

## 2015-02-28 LAB — BASIC METABOLIC PANEL
ANION GAP: 10 (ref 5–15)
BUN: 5 mg/dL — AB (ref 6–20)
CHLORIDE: 91 mmol/L — AB (ref 101–111)
CO2: 26 mmol/L (ref 22–32)
Calcium: 8.3 mg/dL — ABNORMAL LOW (ref 8.9–10.3)
Creatinine, Ser: 0.71 mg/dL (ref 0.61–1.24)
GFR calc Af Amer: >60 mL/min (ref >60)
GFR calc non Af Amer: >60 mL/min (ref >60)
Glucose, Bld: 85 mg/dL (ref 65–99)
POTASSIUM: 4.3 mmol/L (ref 3.5–5.1)
Sodium: 127 mmol/L — ABNORMAL LOW (ref 135–145)

## 2015-02-28 LAB — OSMOLALITY, URINE: Osmolality, Ur: 448 mOsm/kg (ref 390–1090)

## 2015-02-28 LAB — CORTISOL: Cortisol, Plasma: 13.5 ug/dL

## 2015-02-28 LAB — I-STAT TROPONIN, ED: Troponin i, poc: 0 ng/mL (ref 0.00–0.08)

## 2015-02-28 LAB — SODIUM, URINE, RANDOM: Sodium, Ur: 142 mmol/L

## 2015-02-28 LAB — LIPASE, BLOOD: Lipase: 36 U/L (ref 22–51)

## 2015-02-28 LAB — TROPONIN I: Troponin I: <0.03 ng/mL (ref <0.031)

## 2015-02-28 MED ORDER — METHYLPREDNISOLONE SODIUM SUCC 125 MG IJ SOLR
80.0000 mg | Freq: Once | INTRAMUSCULAR | Status: AC
Start: 2015-02-28 — End: 2015-02-28
  Administered 2015-02-28: 80 mg via INTRAVENOUS
  Filled 2015-02-28: qty 2

## 2015-02-28 MED ORDER — ENSURE ENLIVE PO LIQD
237.0000 mL | Freq: Two times a day (BID) | ORAL | Status: DC
  Administered 2015-03-01: 237 mL via ORAL

## 2015-02-28 MED ORDER — METOPROLOL TARTRATE 25 MG PO TABS
25.0000 mg | ORAL_TABLET | Freq: Two times a day (BID) | ORAL | Status: DC
  Administered 2015-02-28 – 2015-03-01 (×2): 25 mg via ORAL
  Filled 2015-02-28 (×3): qty 1

## 2015-02-28 MED ORDER — CYCLOBENZAPRINE HCL 10 MG PO TABS
5.0000 mg | ORAL_TABLET | Freq: Three times a day (TID) | ORAL | Status: DC | PRN
  Administered 2015-03-01: 5 mg via ORAL
  Filled 2015-02-28: qty 1

## 2015-02-28 MED ORDER — SODIUM CHLORIDE 0.9 % IV BOLUS (SEPSIS)
500.0000 mL | Freq: Once | INTRAVENOUS | Status: AC
  Administered 2015-02-28: 500 mL via INTRAVENOUS

## 2015-02-28 MED ORDER — IPRATROPIUM BROMIDE 0.02 % IN SOLN: 0.5000 mg | Freq: Four times a day (QID) | RESPIRATORY_TRACT | Status: DC | PRN

## 2015-02-28 MED ORDER — IPRATROPIUM BROMIDE 0.02 % IN SOLN
0.5000 mg | Freq: Four times a day (QID) | RESPIRATORY_TRACT | Status: DC
  Administered 2015-02-28: 0.5 mg via RESPIRATORY_TRACT
  Filled 2015-02-28: qty 2.5

## 2015-02-28 MED ORDER — ONDANSETRON HCL 4 MG PO TABS: 4.0000 mg | ORAL_TABLET | Freq: Four times a day (QID) | ORAL | Status: DC | PRN

## 2015-02-28 MED ORDER — MORPHINE SULFATE 2 MG/ML IJ SOLN
2.0000 mg | Freq: Once | INTRAMUSCULAR | Status: AC
  Administered 2015-02-28: 2 mg via INTRAVENOUS
  Filled 2015-02-28: qty 1

## 2015-02-28 MED ORDER — MORPHINE SULFATE 4 MG/ML IJ SOLN
4.0000 mg | Freq: Once | INTRAMUSCULAR | Status: AC
  Administered 2015-02-28: 4 mg via INTRAVENOUS
  Filled 2015-02-28: qty 1

## 2015-02-28 MED ORDER — ACETAMINOPHEN 325 MG PO TABS
650.0000 mg | ORAL_TABLET | Freq: Four times a day (QID) | ORAL | Status: DC | PRN
  Administered 2015-03-01: 650 mg via ORAL
  Filled 2015-02-28: qty 2

## 2015-02-28 MED ORDER — ONDANSETRON HCL 4 MG/2ML IJ SOLN: 4.0000 mg | Freq: Four times a day (QID) | INTRAMUSCULAR | Status: DC | PRN

## 2015-02-28 MED ORDER — IOHEXOL 350 MG/ML SOLN
100.0000 mL | Freq: Once | INTRAVENOUS | Status: AC | PRN
  Administered 2015-02-28: 100 mL via INTRAVENOUS

## 2015-02-28 MED ORDER — HYDROMORPHONE HCL 1 MG/ML IJ SOLN: 1.0000 mg | INTRAMUSCULAR | Status: DC | PRN

## 2015-02-28 MED ORDER — METHYLPREDNISOLONE SODIUM SUCC 125 MG IJ SOLR
125.0000 mg | Freq: Once | INTRAMUSCULAR | Status: AC
  Administered 2015-02-28: 125 mg via INTRAVENOUS
  Filled 2015-02-28: qty 2

## 2015-02-28 MED ORDER — SODIUM CHLORIDE 0.9 % IV SOLN
INTRAVENOUS | Status: DC
  Administered 2015-02-28: 21:00:00 via INTRAVENOUS

## 2015-02-28 MED ORDER — ENOXAPARIN SODIUM 40 MG/0.4ML ~~LOC~~ SOLN
40.0000 mg | SUBCUTANEOUS | Status: DC
  Administered 2015-02-28: 40 mg via SUBCUTANEOUS
  Filled 2015-02-28 (×2): qty 0.4

## 2015-02-28 MED ORDER — PNEUMOCOCCAL VAC POLYVALENT 25 MCG/0.5ML IJ INJ
0.5000 mL | INJECTION | INTRAMUSCULAR | Status: DC
  Filled 2015-02-28: qty 0.5

## 2015-02-28 MED ORDER — ALBUTEROL SULFATE (2.5 MG/3ML) 0.083% IN NEBU
5.0000 mg | INHALATION_SOLUTION | Freq: Once | RESPIRATORY_TRACT | Status: AC
  Administered 2015-02-28: 5 mg via RESPIRATORY_TRACT
  Filled 2015-02-28: qty 6

## 2015-02-28 MED ORDER — CETYLPYRIDINIUM CHLORIDE 0.05 % MT LIQD
7.0000 mL | Freq: Two times a day (BID) | OROMUCOSAL | Status: DC
  Administered 2015-02-28 – 2015-03-01 (×2): 7 mL via OROMUCOSAL

## 2015-02-28 MED ORDER — SODIUM CHLORIDE 0.9 % IJ SOLN
3.0000 mL | Freq: Two times a day (BID) | INTRAMUSCULAR | Status: DC
  Administered 2015-02-28 – 2015-03-01 (×2): 3 mL via INTRAVENOUS

## 2015-02-28 MED ORDER — LEVALBUTEROL HCL 0.63 MG/3ML IN NEBU
0.6300 mg | INHALATION_SOLUTION | Freq: Four times a day (QID) | RESPIRATORY_TRACT | Status: DC | PRN
  Filled 2015-02-28: qty 3

## 2015-02-28 MED ORDER — ASPIRIN EC 81 MG PO TBEC
81.0000 mg | DELAYED_RELEASE_TABLET | Freq: Every day | ORAL | Status: DC
  Administered 2015-03-01: 81 mg via ORAL
  Filled 2015-02-28: qty 1

## 2015-02-28 MED ORDER — ACETAMINOPHEN 650 MG RE SUPP: 650.0000 mg | Freq: Four times a day (QID) | RECTAL | Status: DC | PRN

## 2015-02-28 MED ORDER — NICOTINE 21 MG/24HR TD PT24
21.0000 mg | MEDICATED_PATCH | Freq: Every day | TRANSDERMAL | Status: DC
  Administered 2015-02-28 – 2015-03-01 (×2): 21 mg via TRANSDERMAL
  Filled 2015-02-28 (×2): qty 1

## 2015-02-28 NOTE — ED Notes (Signed)
Patient given meal tray.

## 2015-02-28 NOTE — Progress Notes (Signed)
Report received from Dahlia ClientHannah, [place] in [place]. Awaiting pt's arrival.

## 2015-02-28 NOTE — ED Notes (Signed)
CT, [place], notified that labs resulted pt ready for CT. States pt will be next when CT that runs angio is ready.

## 2015-02-28 NOTE — ED Notes (Signed)
Patient returned from CT

## 2015-02-28 NOTE — ED Notes (Signed)
Per EMS, Yesterday night patient was standing on the back porch when his lower back started hurting. Patient took an ibuprofen and the pain did decrease, but he woke up this morning and a dull pain was still present. Patient denies any injury or trauma. Patient has HX of COPD and Aortic Aneurysm. Patient reports coughing up white sputum, but cough has not increased in the last couple of days. Patient ambulated from the stretcher and is alert and oriented x4 upon arrival. Vitals per EMS: 130/72, 80 HR, Rhonchi in all fields.

## 2015-02-28 NOTE — ED Notes (Signed)
Patient ordered diet tray.

## 2015-02-28 NOTE — H&P (Signed)
[facility] History and Physical  Colton Owens ZOX:[id number] DOB: 1941-02-26 DOA: 02/28/2015  Referring physician: ER PCP: Lucilla EdinAUB, STEVE A, MD   Chief Complaint: Back pain HPI:  74 year old male with history of AAA, hypertension, chronic hyponatremia presents with left flank pain. Patient states that he isn't coughing excessively and may have pulled a muscle. The patient has tried ibuprofen at home with no relief. The pain is nonradiating. He denies any shortness of breath or chest pain. No fever. No recent weight loss. Workup in the ED revealed hyponatremia with sodium of 124. Patient has been diagnosed with hyponatremia in the past, he drinks only 16 ounces of water a day and denies use of alcohol. Does not have any history of congestive heart failure      Review of Systems: negative for the following  Constitutional: Denies fever, chills, diaphoresis, appetite change and fatigue.  HEENT: Denies photophobia, eye pain, redness, hearing loss, ear pain, congestion, sore throat, rhinorrhea, sneezing, mouth sores, trouble swallowing, neck pain, neck stiffness and tinnitus.  Respiratory: Denies SOB, DOE, cough, chest tightness, and wheezing.  Cardiovascular: Denies chest pain, palpitations and leg swelling.  Gastrointestinal: Denies nausea, vomiting, abdominal pain, diarrhea, constipation, blood in stool and abdominal distention.  Genitourinary: Denies dysuria, urgency, frequency, hematuria, flank pain and difficulty urinating.  Musculoskeletal: Positive for back pain pain, joint swelling, arthralgias and gait problem.  Skin: Denies pallor, rash and wound.  Neurological: Denies dizziness, seizures, syncope, weakness, light-headedness, numbness and headaches.  Hematological: Denies adenopathy. Easy bruising, personal or family bleeding history  Psychiatric/Behavioral: Denies suicidal ideation, mood changes, confusion, nervousness, sleep disturbance and agitation       Past Medical  History  Diagnosis Date  . Hypertension   . High cholesterol   . Hyponatremia syndrome   . Chronic cor pulmonale   . COPD (chronic obstructive pulmonary disease)   . Aortic aneurysm      Past Surgical History  Procedure Laterality Date  . Angioplasty    . Left heart catheterization with coronary angiogram N/A 10/23/2012    Procedure: LEFT HEART CATHETERIZATION WITH CORONARY ANGIOGRAM;  Surgeon: Pamella PertJagadeesh R Ganji, MD;  [facility];  Service: Cardiovascular;  Laterality: N/A;  . Abdominal angiogram N/A 10/23/2012    Procedure: ABDOMINAL ANGIOGRAM;  Surgeon: Pamella PertJagadeesh R Ganji, MD;  [facility];  Service: Cardiovascular;  Laterality: N/A;      Social History:  reports that he has been smoking Cigarettes.  He has been smoking about 1.00 pack per day. He has never used smokeless tobacco. He reports that he does not drink alcohol or use illicit drugs.    No Known Allergies      FAMILY HISTORY  When questioned  Directly-patient reports  No family history of HTN, CVA ,DIABETES, TB, Cancer CAD, Bleeding Disorders, Sickle Cell, diabetes, anemia, asthma,   Prior to Admission medications   Medication Sig Start Date End Date Taking? Authorizing Provider  aspirin EC 81 MG tablet Take 81 mg by mouth daily.   Yes Historical Provider, MD  metoprolol tartrate (LOPRESSOR) 25 MG tablet Take 25 mg by mouth 2 (two) times daily.   Yes Yates DecampJay Ganji, MD  atorvastatin (LIPITOR) 20 MG tablet Take 1 tablet (20 mg total) by mouth daily at 6 PM. Patient not taking: Reported on 02/28/2015 03/01/13   Ozella Rocksavid J Merrell, MD     Physical Exam: Filed Vitals:   02/28/15 1756 02/28/15 1759 02/28/15 1800 02/28/15 1801  BP:   152/86   Pulse:  70 71  89  Temp:      TempSrc:      Resp: Height:      Weight:      SpO2: 96% 100%  90%     Constitutional: Vital signs reviewed. Patient disheveled and chronically ill-appearing in no acute distress and cooperative with exam. Alert and oriented  x3.  Head: Normocephalic and atraumatic  Ear: TM normal bilaterally  Mouth: no erythema or exudates, MMM  Eyes: PERRL, EOMI, conjunctivae normal, No scleral icterus.  Neck: Supple, Trachea midline normal ROM, No JVD, mass, thyromegaly, or carotid bruit present.  Cardiovascular: RRR, S1 normal, S2 normal, no MRG, pulses symmetric and intact bilaterally  Pulmonary/Chest: CTAB, no wheezes, rales, or rhonchi  Abdominal: Soft. Non-tender, non-distended, bowel sounds are normal, no masses, organomegaly, or guarding present.  GU: no CVA tenderness Musculoskeletal: No joint deformities, erythema, or stiffness, ROM full and no nontender Ext: no edema and no cyanosis, pulses palpable bilaterally (DP and PT)  Hematology: no cervical, inginal, or axillary adenopathy.  Neurological: A&O x3, Strenght is normal and symmetric bilaterally, cranial nerve II-XII are grossly intact, no focal motor deficit, sensory intact to light touch bilaterally.  Skin: Warm, dry and intact. No rash, cyanosis, or clubbing.  Psychiatric: Normal mood and affect. speech and behavior is normal. Judgment and thought content normal. Cognition and memory are normal.       Labs on Admission:    Basic Metabolic Panel:  Recent Labs Lab 02/28/15 1433  NA 124*  K 4.4  CL 86*  CO2 29  GLUCOSE 97  BUN 6  CREATININE 0.76  CALCIUM 8.6*   Liver Function Tests:  Recent Labs Lab 02/28/15 1433  AST 24  ALT 13*  ALKPHOS 61  BILITOT 0.8  PROT 6.1*  ALBUMIN 3.2*    Recent Labs Lab 02/28/15 1433  LIPASE 36   No results for input(s): AMMONIA in the last 168 hours. CBC:  Recent Labs Lab 02/28/15 1433  WBC 9.5  NEUTROABS 7.5  HGB 14.5  HCT 43.3  MCV 96.7  PLT 322   Cardiac Enzymes: No results for input(s): CKTOTAL, CKMB, CKMBINDEX, TROPONINI in the last 168 hours.  BNP (last 3 results) No results for input(s): BNP in the last 8760 hours.  ProBNP (last 3 results) No results for input(s): PROBNP in the  last 8760 hours.     CBG: No results for input(s): GLUCAP in the last 168 hours.  Radiological Exams on Admission: Ct Angio Chest Aorta W/cm &/or Wo/cm  02/28/2015   CLINICAL DATA:  Extremely short of breath and right-sided pain. Productive cough. Concern for aortic dissection  EXAM: CT ANGIOGRAPHY CHEST, ABDOMEN AND PELVIS  TECHNIQUE: Multidetector CT imaging through the chest, abdomen and pelvis was performed using the standard protocol during bolus administration of intravenous contrast. Multiplanar reconstructed images and MIPs were obtained and reviewed to evaluate the vascular anatomy.  CONTRAST:  OMNIPAQUE IOHEXOL 350 MG/ML SOLN  COMPARISON:  None.  FINDINGS: CTA CHEST FINDINGS  Mediastinum/Nodes: Non IV contrast images demonstrates no intramural hematoma within the thoracic aorta. Contrast enhanced imaging demonstrates no aortic dissection or aneurysm. The great vessels are normal.  There are no filling defects within the pulmonary arteries to suggest acute pulmonary embolism. No pericardial fluid.  No mediastinal lymphadenopathy  Lungs/Pleura: Centrilobular emphysema in the upper lobes. There is a mild peribronchial thickening and endobronchial material accumulation likely representing mucus or secretions in the right lower lobe (image 91-24 series  6). No pulmonary edema. No pneumothorax.  Review of the MIP images confirms the above findings.  CTA ABDOMEN AND PELVIS FINDINGS  Vascular: There is no acute aneurysm or dissection of the abdominal aorta. There is mild fusiform dilatation of the infrarenal abdominal aorta to 3.7 cm. There is calcified and noncalcified plaque within the lumen of the aorta. The celiac trunk and SMA are widely patent. There are bilateral patent renal arteries. No iliac artery aneurysm.  Hepatobiliary: No focal hepatic lesion. No biliary duct dilatation. Gallbladder is normal. Common bile duct is normal.  Pancreas: Pancreas is normal. No ductal dilatation. No  pancreatic inflammation.  Spleen: Normal spleen  Adrenals/urinary tract: Adrenal glands and kidneys are normal. The ureters and bladder normal.  Stomach/Bowel: Stomach, small bowel, appendix, and cecum are normal. The colon and rectosigmoid colon are normal.  Vascular/Lymphatic: Abdominal aorta is normal caliber. There is no retroperitoneal or periportal lymphadenopathy. No pelvic lymphadenopathy.  Reproductive: Prostate normal.  Musculoskeletal: No aggressive osseous lesion.  Other: No free fluid.  Review of the MIP images confirms the above findings.  IMPRESSION: Chest Impression:  1. No evidence of aortic dissection or aneurysm. 2. No pulmonary embolism. 3. Mild peribronchial thickening and endoluminal material within the right lower lobe may represent bronchitis. 4. Centrilobular emphysema in the upper lobes.  Abdomen / Pelvis Impression:  1. No Evidence of acute aneurysm or dissection of the abdominal aorta. 2. 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 Recommend followup by ultrasound in 2 years. This recommendation follows ACR consensus guidelines: White Paper of the ACR Incidental Findings Committee II on Vascular Findings. J Am Coll Radiol 2013; 10:789-794.   Electronically Signed   By: Genevive BiStewart  Edmunds M.D.   On: 02/28/2015 17:49   Ct Angio Abd/pel W/ And/or W/o  02/28/2015   CLINICAL DATA:  Extremely short of breath and right-sided pain. Productive cough. Concern for aortic dissection  EXAM: CT ANGIOGRAPHY CHEST, ABDOMEN AND PELVIS  TECHNIQUE: Multidetector CT imaging through the chest, abdomen and pelvis was performed using the standard protocol during bolus administration of intravenous contrast. Multiplanar reconstructed images and MIPs were obtained and reviewed to evaluate the vascular anatomy.  CONTRAST:  100mL OMNIPAQUE IOHEXOL 350 MG/ML SOLN  COMPARISON:  None.  FINDINGS: CTA CHEST FINDINGS  Mediastinum/Nodes: Non IV contrast images demonstrates no intramural hematoma  within the thoracic aorta. Contrast enhanced imaging demonstrates no aortic dissection or aneurysm. The great vessels are normal.  There are no filling defects within the pulmonary arteries to suggest acute pulmonary embolism. No pericardial fluid.  No mediastinal lymphadenopathy  Lungs/Pleura: Centrilobular emphysema in the upper lobes. There is a mild peribronchial thickening and endobronchial material accumulation likely representing mucus or secretions in the right lower lobe (image 91-24 series 6). No pulmonary edema. No pneumothorax.  Review of the MIP images confirms the above findings.  CTA ABDOMEN AND PELVIS FINDINGS  Vascular: There is no acute aneurysm or dissection of the abdominal aorta. There is mild fusiform dilatation of the infrarenal abdominal aorta to 3.7 cm. There is calcified and noncalcified plaque within the lumen of the aorta. The celiac trunk and SMA are widely patent. There are bilateral patent renal arteries. No iliac artery aneurysm.  Hepatobiliary: No focal hepatic lesion. No biliary duct dilatation. Gallbladder is normal. Common bile duct is normal.  Pancreas: Pancreas is normal. No ductal dilatation. No pancreatic inflammation.  Spleen: Normal spleen  Adrenals/urinary tract: Adrenal glands and kidneys are normal. The ureters and bladder normal.  Stomach/Bowel:  Stomach, small bowel, appendix, and cecum are normal. The colon and rectosigmoid colon are normal.  Vascular/Lymphatic: Abdominal aorta is normal caliber. There is no retroperitoneal or periportal lymphadenopathy. No pelvic lymphadenopathy.  Reproductive: Prostate normal.  Musculoskeletal: No aggressive osseous lesion.  Other: No free fluid.  Review of the MIP images confirms the above findings.  IMPRESSION: Chest Impression:  1. No evidence of aortic dissection or aneurysm. 2. No pulmonary embolism. 3. Mild peribronchial thickening and endoluminal material within the right lower lobe may represent bronchitis. 4. Centrilobular  emphysema in the upper lobes.  Abdomen / Pelvis Impression:  1. No Evidence of acute aneurysm or dissection of the abdominal aorta. 2. 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 Recommend followup by ultrasound in 2 years. This recommendation follows ACR consensus guidelines: White Paper of the ACR Incidental Findings Committee II on Vascular Findings. J Am Coll Radiol 2013; 10:789-794.   Electronically Signed   By: Genevive Bi M.D.   On: 02/28/2015 17:49    EKG: Independently reviewed. Assessment/Plan Active Problems:   Hyponatremia   Hypovolemic Hyponatremia , likely chronic, previous evidence of sodium in the 120s Asymptomatic Will check urine osmolality, serum osmolality, urine sodium, TSH, cortisol Home medications reviewed and the patient is not on anything that would drop his sodium No recent illnesses no diarrhea reported, denies use of alcohol quit drinking 5 years ago Patient appears to be mildly dehydrated therefore we'll start the patient on saline IV Serial BMP,  Flank pain No UA, no obvious intra-abdominal pathology Likely musculoskeletal Will start the patient on Flexeril, give 1 dose of IV Solu-Medrol When necessary Dilaudid  Hypertension continue metoprolol  Code Status:   full Family Communication: bedside Disposition Plan: admit   Time spent: 70 mins   [facility] Pager [phone]  If 7PM-7AM, please contact night-coverage [email or web address] Password [facility] 02/28/2015, 6:41 PM

## 2015-02-28 NOTE — ED Notes (Signed)
Admitting MD at the bedside.  

## 2015-02-28 NOTE — ED Provider Notes (Addendum)
CSN: [id number]     Arrival date & time 02/28/15  1311 History   First MD Initiated Contact with Patient 02/28/15 1328     Chief Complaint  Patient presents with  . Back Pain     (Consider location/radiation/quality/duration/timing/severity/associated sxs/prior Treatment) HPI 74 year old male history of AAA who presents today complaining of bilateral flank pain. He states it began last night after dinner. He states that he took an ibuprofen in the early morning hours. It has been continuous aching type pain. It is moderate to severe. He denies that he has fallen or injured himself in any way. He is dyspneic but it is at baseline for him. He is a smoker and continues to smoke. He received ports his sputum production at baseline. He reports no other increasing or decreasing factors for the pain. Reports no radiation of the pain. He has had weight loss over the past several months with decreased by mouth intake. He states he is just not had a good appetite. He has not noted any rectal bleeding. Past Medical History  Diagnosis Date  . Hypertension   . High cholesterol   . Hyponatremia syndrome   . Chronic cor pulmonale   . COPD (chronic obstructive pulmonary disease)   . Aortic aneurysm    Past Surgical History  Procedure Laterality Date  . Angioplasty    . Left heart catheterization with coronary angiogram N/A 10/23/2012    Procedure: LEFT HEART CATHETERIZATION WITH CORONARY ANGIOGRAM;  Surgeon: Pamella PertJagadeesh R Ganji, MD;  [facility];  Service: Cardiovascular;  Laterality: N/A;  . Abdominal angiogram N/A 10/23/2012    Procedure: ABDOMINAL ANGIOGRAM;  Surgeon: Pamella PertJagadeesh R Ganji, MD;  [facility];  Service: Cardiovascular;  Laterality: N/A;   No family history on file. History  Substance Use Topics  . Smoking status: Current Every Day Smoker -- 1.00 packs/day    Types: Cigarettes  . Smokeless tobacco: Never Used  . Alcohol Use: No    Review of Systems  Constitutional:  Positive for activity change and unexpected weight change.  Eyes: Negative.   Respiratory: Positive for shortness of breath.   Cardiovascular: Negative for chest pain.  Gastrointestinal: Negative.   Genitourinary: Negative.   Musculoskeletal: Positive for back pain.  Allergic/Immunologic: Negative.   Neurological: Negative.   Hematological: Negative.   Psychiatric/Behavioral: Negative.   All other systems reviewed and are negative.     Allergies  Review of patient's allergies indicates no known allergies.  Home Medications   Prior to Admission medications   Medication Sig Start Date End Date Taking? Authorizing Provider  aspirin EC 81 MG tablet Take 81 mg by mouth daily.    Historical Provider, MD  atorvastatin (LIPITOR) 20 MG tablet Take 1 tablet (20 mg total) by mouth daily at 6 PM. 03/01/13   Ozella Rocksavid J Merrell, MD  diltiazem (CARDIZEM CD) 240 MG 24 hr capsule Take 240 mg by mouth daily.    Yates DecampJay Ganji, MD  metoprolol tartrate (LOPRESSOR) 25 MG tablet Take 25 mg by mouth 2 (two) times daily.    Yates DecampJay Ganji, MD  Multiple Vitamin (MULTIVITAMIN WITH MINERALS) TABS Take 1 tablet by mouth daily.    Historical Provider, MD  OVER THE COUNTER MEDICATION Take 1 tablet by mouth daily as needed (allergies). OTC allergy medication    Historical Provider, MD   BP 167/90 mmHg  Pulse 84  Temp(Src) 98.1 F (36.7 C) (Oral)  Resp 16  Ht 5\' 11"  (1.803 m)  Wt 138 lb  11.2 oz (62.914 kg)  BMI 19.35 kg/m2  SpO2 96% Physical Exam  Constitutional: He is oriented to person, place, and time. He appears well-developed.  Early, chronically ill appearing, mildly cachectic male no acute distress  HENT:  Head: Normocephalic and atraumatic.  Right Ear: External ear normal.  Nose: Nose normal.  Mouth/Throat: Oropharynx is clear and moist.  Eyes: Pupils are equal, round, and reactive to light.  Neck: Normal range of motion. Neck supple.  Cardiovascular: Normal rate and regular rhythm.   Pulmonary/Chest:   Decreased breath sounds throughout without rhonchi or wheezing.  Abdominal: Soft. Bowel sounds are normal. He exhibits no distension. There is no tenderness. There is no rebound and no guarding.  Musculoskeletal: Normal range of motion. He exhibits edema.  No visual abnormalities of back, no palpable tenderness or swelling.  Neurological: He is alert and oriented to person, place, and time. He has normal reflexes. He displays normal reflexes. No cranial nerve deficit. Coordination normal.  Skin: Skin is warm and dry.  Psychiatric: He has a normal mood and affect.  Nursing note and vitals reviewed.   ED Course  Procedures (including critical care time) Labs Review Labs Reviewed  CBC WITH DIFFERENTIAL/PLATELET - Abnormal; Notable for the following:    Neutrophils Relative % 79 (*)    All other components within normal limits  COMPREHENSIVE METABOLIC PANEL - Abnormal; Notable for the following:    Sodium 124 (*)    Chloride 86 (*)    Calcium 8.6 (*)    Total Protein 6.1 (*)    Albumin 3.2 (*)    ALT 13 (*)    All other components within normal limits  URINALYSIS, ROUTINE W REFLEX MICROSCOPIC - Abnormal; Notable for the following:    Ketones, ur 15 (*)    All other components within normal limits  LIPASE, BLOOD  I-STAT TROPOININ, ED    Imaging Review No results found.   EKG Interpretation   Date/Time:  Saturday Feb 28 2015 14:12:44 EDT Ventricular Rate:  71 PR Interval:  121 QRS Duration: 72 QT Interval:  393 QTC Calculation: 427 R Axis:   -69 Text Interpretation:  Normal sinus rhythm Left anterior fasicular block  peaked t waves Confirmed by Taliya Mcclard MD, Duwayne HeckANIELLE ([id number]) on 02/28/2015 3:01:27  PM      MDM   Final diagnoses:  Flank pain  Pain    Patient with bilateral flank pain.  He is elderly and lives alone.  His labs here are consistent with mild hyponatremia which patient had previously and was corrected with fluid restriction and resolved.   Patient feels  improved after pain meds.  CT pending.    1-flank pain-  2- hypochloremic hyponatremia 3-copd dyspnea- patient with some increased dyspnea from baseline but denies changes in sputum production.  Solumedrol and albuterol given here.  Sats 92-96%.    Margarita Grizzleanielle Priyansh Pry, MD 02/28/15 1723 Discussed with Dr. Denton LankSteinl and he will arrange admission.   Margarita Grizzleanielle Walker [place], MD 02/28/15 [phone]

## 2015-02-28 NOTE — ED Notes (Signed)
Called Lab to follow up with blood work. Waiting for results so patient can go to CT.

## 2015-02-28 NOTE — ED Notes (Signed)
MD Ray at the bedside. 

## 2015-02-28 NOTE — Progress Notes (Signed)
NURSING PROGRESS NOTE  Lura Emarl W [place] [id number] Admission Data: 02/28/2015 9:32 PM Attending Provider: Richarda OverlieNayana Abrol, MD OZD:GUYQPCP:DAUB, Stan HeadSTEVE A, MD Code Status: Full  Lura Emarl W Bielicki is a 74 y.o. male patient admitted from ED:  -No acute distress noted.  -No complaints of shortness of breath.  -No complaints of chest pain.   Cardiac Monitoring: Box # 11 in place. Cardiac monitor yields:NSR  Blood pressure 160/83, pulse 83, temperature 98.3 F (36.8 C), temperature source Oral, resp. rate 20, height 5' 1.32" (1.558 m), weight 61.2 kg (134 lb 14.7 oz), SpO2 95 %.   IV Fluids:  IV in place, occlusive dsg intact without redness, IV cath intact in lt hand and Rt AC Allergies:  Review of patient's allergies indicates no known allergies.  Past Medical History:   has a past medical history of Hypertension; High cholesterol; Hyponatremia syndrome; Chronic cor pulmonale; COPD (chronic obstructive pulmonary disease); and Aortic aneurysm.  Past Surgical History:   has past surgical history that includes Angioplasty; left heart catheterization with coronary angiogram (N/A, 10/23/2012); and abdominal angiogram (N/A, 10/23/2012).  Social History:   reports that he has been smoking Cigarettes.  He has a 58 pack-year smoking history. He has never used smokeless tobacco. He reports that he does not drink alcohol or use illicit drugs.  Skin: Intact  Patient/Family orientated to room. Information packet given to patient/family. Admission inpatient armband information verified with patient/family to include name and date of birth and placed on patient arm. Side rails up x 2, fall assessment and education completed with patient/family. Patient/family able to verbalize understanding of risk associated with falls and verbalized understanding to call for assistance before getting out of bed. Call light within reach. Patient/family able to voice and demonstrate understanding of unit orientation instructions.

## 2015-02-28 NOTE — ED Notes (Signed)
Patient being transported to floor by Chastity, EMT.

## 2015-03-01 ENCOUNTER — Encounter (HOSPITAL_COMMUNITY): Payer: Self-pay | Admitting: Internal Medicine

## 2015-03-01 DIAGNOSIS — J449 Chronic obstructive pulmonary disease, unspecified: Secondary | ICD-10-CM | POA: Diagnosis present

## 2015-03-01 DIAGNOSIS — R109 Unspecified abdominal pain: Secondary | ICD-10-CM | POA: Diagnosis present

## 2015-03-01 DIAGNOSIS — E871 Hypo-osmolality and hyponatremia: Principal | ICD-10-CM

## 2015-03-01 DIAGNOSIS — R10A3 Flank pain, bilateral: Secondary | ICD-10-CM | POA: Diagnosis present

## 2015-03-01 DIAGNOSIS — M549 Dorsalgia, unspecified: Secondary | ICD-10-CM | POA: Diagnosis not present

## 2015-03-01 DIAGNOSIS — R5381 Other malaise: Secondary | ICD-10-CM

## 2015-03-01 DIAGNOSIS — J9611 Chronic respiratory failure with hypoxia: Secondary | ICD-10-CM

## 2015-03-01 HISTORY — DX: Other malaise: R53.81

## 2015-03-01 HISTORY — DX: Chronic respiratory failure with hypoxia: J96.11

## 2015-03-01 LAB — BASIC METABOLIC PANEL
ANION GAP: 10 (ref 5–15)
Anion gap: 7 (ref 5–15)
BUN: 6 mg/dL (ref 6–20)
BUN: <5 mg/dL — ABNORMAL LOW (ref 6–20)
CALCIUM: 8 mg/dL — AB (ref 8.9–10.3)
CALCIUM: 8.5 mg/dL — AB (ref 8.9–10.3)
CO2: 30 mmol/L (ref 22–32)
CO2: 27 mmol/L (ref 22–32)
CREATININE: 0.7 mg/dL (ref 0.61–1.24)
Chloride: 91 mmol/L — ABNORMAL LOW (ref 101–111)
Chloride: 92 mmol/L — ABNORMAL LOW (ref 101–111)
Creatinine, Ser: 0.67 mg/dL (ref 0.61–1.24)
GFR calc Af Amer: >60 mL/min (ref >60)
GFR calc non Af Amer: >60 mL/min (ref >60)
Glucose, Bld: 188 mg/dL — ABNORMAL HIGH (ref 65–99)
Glucose, Bld: 132 mg/dL — ABNORMAL HIGH (ref 65–99)
POTASSIUM: 4 mmol/L (ref 3.5–5.1)
Potassium: 4.3 mmol/L (ref 3.5–5.1)
Sodium: 128 mmol/L — ABNORMAL LOW (ref 135–145)
Sodium: 129 mmol/L — ABNORMAL LOW (ref 135–145)

## 2015-03-01 LAB — COMPREHENSIVE METABOLIC PANEL
ALBUMIN: 2.7 g/dL — AB (ref 3.5–5.0)
ALT: 11 U/L — ABNORMAL LOW (ref 17–63)
AST: 21 U/L (ref 15–41)
Alkaline Phosphatase: 54 U/L (ref 38–126)
Anion gap: 8 (ref 5–15)
BUN: 5 mg/dL — AB (ref 6–20)
CO2: 29 mmol/L (ref 22–32)
Calcium: 8.5 mg/dL — ABNORMAL LOW (ref 8.9–10.3)
Chloride: 92 mmol/L — ABNORMAL LOW (ref 101–111)
Creatinine, Ser: 0.69 mg/dL (ref 0.61–1.24)
GFR calc Af Amer: >60 mL/min (ref >60)
GFR calc non Af Amer: >60 mL/min (ref >60)
Glucose, Bld: 133 mg/dL — ABNORMAL HIGH (ref 65–99)
Potassium: 4.3 mmol/L (ref 3.5–5.1)
SODIUM: 129 mmol/L — AB (ref 135–145)
Total Bilirubin: 0.6 mg/dL (ref 0.3–1.2)
Total Protein: 5.7 g/dL — ABNORMAL LOW (ref 6.5–8.1)

## 2015-03-01 LAB — CBC
HCT: 40 % (ref 39.0–52.0)
Hemoglobin: 13.1 g/dL (ref 13.0–17.0)
MCH: 31.6 pg (ref 26.0–34.0)
MCHC: 32.8 g/dL (ref 30.0–36.0)
MCV: 96.6 fL (ref 78.0–100.0)
PLATELETS: 306 10*3/uL (ref 150–400)
RBC: 4.14 MIL/uL — ABNORMAL LOW (ref 4.22–5.81)
RDW: 11.7 % (ref 11.5–15.5)
WBC: 3.3 10*3/uL — ABNORMAL LOW (ref 4.0–10.5)

## 2015-03-01 LAB — TROPONIN I

## 2015-03-01 MED ORDER — ACETAMINOPHEN 325 MG PO TABS: 650.0000 mg | ORAL_TABLET | Freq: Four times a day (QID) | ORAL | Status: DC | PRN

## 2015-03-01 MED ORDER — CYCLOBENZAPRINE HCL 5 MG PO TABS: 5.0000 mg | ORAL_TABLET | Freq: Three times a day (TID) | ORAL | Status: DC | PRN

## 2015-03-01 NOTE — Care Management Note (Addendum)
Case Management Note  Patient Details  Name: Colton Owens MRN: [id number] Date of Birth: Mar 13, 1941  Subjective/Objective:                   Chief Complaint: Back pain Action/Plan:  Discharge plan Expected Discharge Date:                  Expected Discharge Plan:  [place]  In-House Referral:     Discharge planning Services  CM Consult  Post Acute Care Choice:  Home Health Choice offered to:  Patient  DME Arranged:    DME Agency:  [place] Arranged:  PT, OT, RN, Nurse's Aide [place] Agency:  [place]  Status of Service:  Completed, signed off  Medicare Important Message Given:    Date Medicare IM Given:    Medicare IM give by:    Date Additional Medicare IM Given:    Additional Medicare Important Message give by:     If discussed at [place] of Stay Meetings, dates discussed:    Additional Comments: 14:00 Cm received text to please arrange home 02.  RN to place Pulmonary Saturation note; CM called [facility] DME rep, james to please deliver O2 tank to room prior to discharge.  No other CM needs were communicated.   CM met with pt in room to offer choice of home health agency.  Pt chooses [facility] to render HHPT/OT/nurse/aide.  Address and contact information verified by pt.  Referral called to [facility] rep, Tiffany.  NO other CM needs were communicated.   Dellie Catholic, RN 03/01/2015, 10:46 AM

## 2015-03-01 NOTE — Progress Notes (Signed)
Per PT patient O2 sat was 86% while ambulating with no oxygen.

## 2015-03-01 NOTE — Progress Notes (Signed)
SATURATION QUALIFICATIONS: (This note is used to comply with regulatory documentation for home oxygen)  Patient Saturations on Room Air at Rest = 88 %  Patient Saturations on Room Air while Ambulating = 86 %   Patient Saturations on 2 Liters of oxygen while Ambulating = 98 %  Please briefly explain why patient needs home oxygen: 

## 2015-03-01 NOTE — Progress Notes (Signed)
Patient discharge teaching given, including activity, diet, follow-up appoints, and medications. Patient verbalized understanding of all discharge instructions. IV access was d/c'd. Vitals are stable. Skin is intact except as charted in most recent assessments. Pt to be escorted out by NT, to be driven home by family. 

## 2015-03-01 NOTE — Discharge Summary (Signed)
Physician Discharge Summary  Lura Emarl W Stumph GEX:[id number] DOB: 06/08/1941 DOA: 02/28/2015  PCP: Lucilla EdinAUB, STEVE A, MD  Admit date: 02/28/2015 Discharge date: 03/01/2015  Time spent: greater than 30 minutes  Recommendations for Outpatient Follow-up:  1. Monitor sodium 2. Home pt, rn, aide oxygen 3. Home oxygen arranged  Discharge Diagnoses:  Principal Problem:   Hyponatremia Active Problems:   Essential hypertension, benign   Bilateral lower extremity edema   Bilateral flank pain   Chronic respiratory failure with hypoxia   COPD (chronic obstructive pulmonary disease)   Debility  Discharge Condition: stable  Diet recommendation: general  Filed Weights   02/28/15 1315 02/28/15 2038  Weight: 62.914 kg (138 lb 11.2 oz) 61.2 kg (134 lb 14.7 oz)    History of present illness:  74 year old male with history of AAA, hypertension, copd, chronic hyponatremia presents with left flank pain. Patient states that he isn't coughing excessively and may have pulled a muscle. The patient has tried ibuprofen at home with no relief. The pain is nonradiating. He denies any shortness of breath or chest pain. No fever. No recent weight loss. Workup in the ED revealed hyponatremia with sodium of 124. Patient has been diagnosed with hyponatremia in the past, he drinks only 16 ounces of water a day and denies use of alcohol. Does not have any history of congestive heart failure  Hospital Course:  Felt to be slightly volume depleted on admission. Started on hydration with improvement of sodium to 129.  Back pain muscular spasm better with flexeril.  UA negative. Patient back to clinical baseline. I walked with him in the halls. He is slightly off balance, but reports this is chronic.  sats dropped to 84 with ambulation. Lungs clear and denies dyspnea. Suspect chronic hypoxic respiratory failure from COPD. No wheeze of cough to suggest acute component. Home oxygen arranged.  Worked with PT who felt patient would  benefit from SNF.  Walked 150 feet. Patient declines.  He is at clinical baseline and does need continued hospitalization.  Procedures:  none  Consultations:  none  Discharge Exam: Filed Vitals:   03/01/15 0934  BP: 130/64  Pulse: 62  Temp:   Resp:     General: elderly. Frail. A and o. We walked in the hall. Slightly unsteady, but did not require assistance Cardiovascular: RRR Respiratory: CTA Ext ankle edema  Discharge Instructions   Discharge Instructions    Diet general    Complete by:  As directed      Increase activity slowly    Complete by:  As directed           Current Discharge Medication List    START taking these medications   Details  acetaminophen (TYLENOL) 325 MG tablet Take 2 tablets (650 mg total) by mouth every 6 (six) hours as needed for mild pain (or Fever >/= 101).    cyclobenzaprine (FLEXERIL) 5 MG tablet Take 1 tablet (5 mg total) by mouth 3 (three) times daily as needed for muscle spasms. Qty: 15 tablet, Refills: 0      CONTINUE these medications which have NOT CHANGED   Details  aspirin EC 81 MG tablet Take 81 mg by mouth daily.    metoprolol tartrate (LOPRESSOR) 25 MG tablet Take 25 mg by mouth 2 (two) times daily.      STOP taking these medications     atorvastatin (LIPITOR) 20 MG tablet        No Known Allergies Follow-up Information    Follow up  with DAUB, STEVE A, MD. Schedule an appointment as soon as possible for a visit in 2 weeks.   Specialty:  Family Medicine   Contact information:   [address] [place] [id number] [phone]        The results of significant diagnostics from this hospitalization (including imaging, microbiology, ancillary and laboratory) are listed below for reference.    Significant Diagnostic Studies: Ct Angio Chest Aorta W/cm &/or Wo/cm  02/28/2015   CLINICAL DATA:  Extremely short of breath and right-sided pain. Productive cough. Concern for aortic dissection  EXAM: CT ANGIOGRAPHY  CHEST, ABDOMEN AND PELVIS  TECHNIQUE: Multidetector CT imaging through the chest, abdomen and pelvis was performed using the standard protocol during bolus administration of intravenous contrast. Multiplanar reconstructed images and MIPs were obtained and reviewed to evaluate the vascular anatomy.  CONTRAST:  100mL OMNIPAQUE IOHEXOL 350 MG/ML SOLN  COMPARISON:  None.  FINDINGS: CTA CHEST FINDINGS  Mediastinum/Nodes: Non IV contrast images demonstrates no intramural hematoma within the thoracic aorta. Contrast enhanced imaging demonstrates no aortic dissection or aneurysm. The great vessels are normal.  There are no filling defects within the pulmonary arteries to suggest acute pulmonary embolism. No pericardial fluid.  No mediastinal lymphadenopathy  Lungs/Pleura: Centrilobular emphysema in the upper lobes. There is a mild peribronchial thickening and endobronchial material accumulation likely representing mucus or secretions in the right lower lobe (image [id number] series 6). No pulmonary edema. No pneumothorax.  Review of the MIP images confirms the above findings.  CTA ABDOMEN AND PELVIS FINDINGS  Vascular: There is no acute aneurysm or dissection of the abdominal aorta. There is mild fusiform dilatation of the infrarenal abdominal aorta to 3.7 cm. There is calcified and noncalcified plaque within the lumen of the aorta. The celiac trunk and SMA are widely patent. There are bilateral patent renal arteries. No iliac artery aneurysm.  Hepatobiliary: No focal hepatic lesion. No biliary duct dilatation. Gallbladder is normal. Common bile duct is normal.  Pancreas: Pancreas is normal. No ductal dilatation. No pancreatic inflammation.  Spleen: Normal spleen  Adrenals/urinary tract: Adrenal glands and kidneys are normal. The ureters and bladder normal.  Stomach/Bowel: Stomach, small bowel, appendix, and cecum are normal. The colon and rectosigmoid colon are normal.  Vascular/Lymphatic: Abdominal aorta is normal caliber.  There is no retroperitoneal or periportal lymphadenopathy. No pelvic lymphadenopathy.  Reproductive: Prostate normal.  Musculoskeletal: No aggressive osseous lesion.  Other: No free fluid.  Review of the MIP images confirms the above findings.  IMPRESSION: Chest Impression:  1. No evidence of aortic dissection or aneurysm. 2. No pulmonary embolism. 3. Mild peribronchial thickening and endoluminal material within the right lower lobe may represent bronchitis. 4. Centrilobular emphysema in the upper lobes.  Abdomen / Pelvis Impression:  1. No Evidence of acute aneurysm or dissection of the abdominal aorta. 2. 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 Recommend followup by ultrasound in 2 years. This recommendation follows ACR consensus guidelines: White Paper of the ACR Incidental Findings Committee II on Vascular Findings. J Am Coll Radiol 2013; 10:789-794.   Electronically Signed   By: Genevive BiStewart  Edmunds M.D.   On: 02/28/2015 17:49   Ct Angio Abd/pel W/ And/or W/o  02/28/2015   CLINICAL DATA:  Extremely short of breath and right-sided pain. Productive cough. Concern for aortic dissection  EXAM: CT ANGIOGRAPHY CHEST, ABDOMEN AND PELVIS  TECHNIQUE: Multidetector CT imaging through the chest, abdomen and pelvis was performed using the standard protocol during bolus administration of intravenous contrast.  Multiplanar reconstructed images and MIPs were obtained and reviewed to evaluate the vascular anatomy.  CONTRAST:  OMNIPAQUE IOHEXOL 350 MG/ML SOLN  COMPARISON:  None.  FINDINGS: CTA CHEST FINDINGS  Mediastinum/Nodes: Non IV contrast images demonstrates no intramural hematoma within the thoracic aorta. Contrast enhanced imaging demonstrates no aortic dissection or aneurysm. The great vessels are normal.  There are no filling defects within the pulmonary arteries to suggest acute pulmonary embolism. No pericardial fluid.  No mediastinal lymphadenopathy  Lungs/Pleura: Centrilobular emphysema  in the upper lobes. There is a mild peribronchial thickening and endobronchial material accumulation likely representing mucus or secretions in the right lower lobe (image 91-24 series 6). No pulmonary edema. No pneumothorax.  Review of the MIP images confirms the above findings.  CTA ABDOMEN AND PELVIS FINDINGS  Vascular: There is no acute aneurysm or dissection of the abdominal aorta. There is mild fusiform dilatation of the infrarenal abdominal aorta to 3.7 cm. There is calcified and noncalcified plaque within the lumen of the aorta. The celiac trunk and SMA are widely patent. There are bilateral patent renal arteries. No iliac artery aneurysm.  Hepatobiliary: No focal hepatic lesion. No biliary duct dilatation. Gallbladder is normal. Common bile duct is normal.  Pancreas: Pancreas is normal. No ductal dilatation. No pancreatic inflammation.  Spleen: Normal spleen  Adrenals/urinary tract: Adrenal glands and kidneys are normal. The ureters and bladder normal.  Stomach/Bowel: Stomach, small bowel, appendix, and cecum are normal. The colon and rectosigmoid colon are normal.  Vascular/Lymphatic: Abdominal aorta is normal caliber. There is no retroperitoneal or periportal lymphadenopathy. No pelvic lymphadenopathy.  Reproductive: Prostate normal.  Musculoskeletal: No aggressive osseous lesion.  Other: No free fluid.  Review of the MIP images confirms the above findings.  IMPRESSION: Chest Impression:  1. No evidence of aortic dissection or aneurysm. 2. No pulmonary embolism. 3. Mild peribronchial thickening and endoluminal material within the right lower lobe may represent bronchitis. 4. Centrilobular emphysema in the upper lobes.  Abdomen / Pelvis Impression:  1. No Evidence of acute aneurysm or dissection of the abdominal aorta. 2. 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 Recommend followup by ultrasound in 2 years. This recommendation follows ACR consensus guidelines: White Paper of the ACR  Incidental Findings Committee II on Vascular Findings. J Am Coll Radiol 2013; 10:789-794.   Electronically Signed   By: Genevive Bi M.D.   On: 02/28/2015 17:49    Microbiology: No results found for this or any previous visit (from the past 240 hour(s)).   Labs: Basic Metabolic Panel:  Recent Labs Lab 02/28/15 1433 02/28/15 1854 03/01/15 0042 03/01/15 0619 03/01/15 0639  NA 124* 127* 128* 129* 129*  K 4.4 4.3 4.0 4.3 4.3  CL 86* 91* 91* 92* 92*  CO2 GLUCOSE 97 85 188* 133* 132*  BUN 6 5* 6 5* <5*  CREATININE 0.76 0.71 0.70 0.69 0.67  CALCIUM 8.6* 8.3* 8.0* 8.5* 8.5*   Liver Function Tests:  Recent Labs Lab 02/28/15 1433 03/01/15 0619  AST 24 21  ALT 13* 11*  ALKPHOS 61 54  BILITOT 0.8 0.6  PROT 6.1* 5.7*  ALBUMIN 3.2* 2.7*    Recent Labs Lab 02/28/15 1433  LIPASE 36   No results for input(s): AMMONIA in the last 168 hours. CBC:  Recent Labs Lab 02/28/15 1433 02/28/15 1854 03/01/15 0619  WBC 9.5 7.4 3.3*  NEUTROABS 7.5  --   --   HGB 14.5 14.5 13.1  HCT  43.3 42.7 40.0  MCV 96.7 96.4 96.6  PLT 322 279 306   Cardiac Enzymes:  Recent Labs Lab 02/28/15 1854 03/01/15 0042 03/01/15 0639  TROPONINI <0.03 <0.03 <0.03   BNP: BNP (last 3 results) No results for input(s): BNP in the last 8760 hours.  ProBNP (last 3 results) No results for input(s): PROBNP in the last 8760 hours.  CBG: No results for input(s): GLUCAP in the last 168 hours.     SignedChristiane Ha  [facility] 03/01/2015, 10:22 AM

## 2015-03-01 NOTE — Evaluation (Signed)
Physical Therapy Evaluation Patient Details Name: Colton Owens MRN: [id number] DOB: 1941/03/28 Today's Date: 03/01/2015   History of Present Illness    74 year old male with history of AAA, hypertension, chronic hyponatremia presents with left flank pain.   Clinical Impression  Pt presents to PT with significant limitations to functional mobility related to physical and medical findings which make him a HIGH risk for fall, fall-related injury, and potential for early readmission.  Pt has gait instability requiring new use of ambulatory aid, has weakness, trunk ROM limitations, pain in flank.  Upon walking, pt c/o onset/worsening dyspnea, spot check revealed SaO2 at 86-88% during activity that does not improve with rest.  HR noted to be irregular with rate 110 during activity, drops to 50's then to 30's then slowly returns to 110's.  (Note that 110 bpm is 75% of pt's age-predicted heart rate max).  Pt noted to have bilateral LE edema.  Reported to RN, called Dr. Conley Canal to report findings.  Communicated PT concern for pt's safety to d/c home as pt lives alone, has steps to enter home, has never worn O2 before, has never used RW before, and low likelihood to have [facility] services for at least 24 hours.  Pt does not want to go to SNF.  Pt has been d/c'd home.      Follow Up Recommendations SNF;Home health PT;Supervision/Assistance - 24 hour    Equipment Recommendations  Rolling walker with 5" wheels (Oxygen)    Recommendations for Other Services       Precautions / Restrictions Precautions Precautions: Fall Precaution Comments: needs RW, likely O2 per clinical presentation Restrictions Weight Bearing Restrictions: No      Mobility  Bed Mobility Overal bed mobility: Modified Independent             General bed mobility comments: HOB up, moves to/from surface  Transfers Overall transfer level: Needs assistance Equipment used: Rolling walker (2 wheeled);1 person hand held  assist Transfers: Sit to/from Stand Sit to Stand: Min guard         General transfer comment: safety concerns due to report of weakness; assist/cues to slow descent  Ambulation/Gait Ambulation/Gait assistance: Min assist Ambulation Distance (Feet): 150 Feet Assistive device: Rolling walker (2 wheeled) Gait Pattern/deviations: Step-through pattern;Shuffle;Trunk flexed Gait velocity: decr Gait velocity interpretation: Below normal speed for age/gender General Gait Details: slow, shuffles, incr dyspnea and need for freq standing rest breaks.  Progressive weakness noted as well.  New need for RW for stabilty as pt reports furniture walking  Stairs            Wheelchair Mobility    Modified Rankin (Stroke Patients Only)       Balance Overall balance assessment: Needs assistance Sitting-balance support: No upper extremity supported;Feet supported Sitting balance-Leahy Scale: Good     Standing balance support: During functional activity;Single extremity supported Standing balance-Leahy Scale: Poor                               Pertinent Vitals/Pain Pain Assessment: 0-10 Pain Score: 6  Pain Location: ribs/flank Pain Intervention(s): Limited activity within patient's tolerance    Home Living Family/patient expects to be discharged to:: Private residence Living Arrangements: Alone   Type of Home: House Home Access: Stairs to enter Entrance Stairs-Rails: Left Entrance Stairs-Number of Steps: 4 Home Layout: One level Home Equipment: None Additional Comments: may have neighbor that can come check on him    Prior  Function Level of Independence: Independent               Hand Dominance        Extremity/Trunk Assessment   Upper Extremity Assessment: Generalized weakness           Lower Extremity Assessment: Generalized weakness      Cervical / Trunk Assessment: Kyphotic  Communication   Communication: No difficulties  Cognition  Arousal/Alertness: Awake/alert Behavior During Therapy: WFL for tasks assessed/performed Overall Cognitive Status: Within Functional Limits for tasks assessed                      General Comments General comments (skin integrity, edema, etc.): dry flaky skin, edema noted in extremities    Exercises        Assessment/Plan    PT Assessment Patient needs continued PT services;All further PT needs can be met in the next venue of care (MD d/c'd patient home prior to PT eval)  PT Diagnosis Difficulty walking   PT Problem List Decreased skin integrity;Cardiopulmonary status limiting activity;Decreased knowledge of use of DME;Decreased mobility;Decreased balance;Decreased activity tolerance;Decreased strength;Decreased range of motion  PT Treatment Interventions     PT Goals (Current goals can be found in the Care Plan section) Acute Rehab PT Goals Patient Stated Goal: feel better PT Goal Formulation: All assessment and education complete, DC therapy (MD d/c'd patient prior to PT evaluaion)    Frequency     Barriers to discharge Decreased caregiver support;Inaccessible home environment steps to enter and lives alone    Co-evaluation               End of Session Equipment Utilized During Treatment: Gait belt Activity Tolerance: Patient limited by fatigue Patient left: in bed;with call bell/phone within reach Nurse Communication: Mobility status         Time: 1312-1340 PT Time Calculation (min) (ACUTE ONLY): 28 min   Charges:   PT Evaluation $Initial PT Evaluation Tier I: 1 Procedure     PT G Codes:        Herbie Drape 03/01/2015, 1:57 PM

## 2015-03-06 ENCOUNTER — Telehealth: Payer: Self-pay

## 2015-03-06 NOTE — Telephone Encounter (Signed)
[facility] is calling because the patient reports having constapation and hasn't had a bowel movement for a week. Patient would like to advising. Please call patient! [phone]

## 2015-03-07 NOTE — Telephone Encounter (Signed)
Patient states he brought a stool softner and laxative and took this am. Patient advised if no stool is passed to come in to be seen.

## 2015-03-10 ENCOUNTER — Inpatient Hospital Stay (HOSPITAL_COMMUNITY)
Admission: EM | Admit: 2015-03-10 | Discharge: 2015-03-18 | DRG: 643 | Disposition: A | Discharge status: Skilled Nursing Facility | Payer: Medicare Other | Attending: Internal Medicine | Admitting: Internal Medicine

## 2015-03-10 ENCOUNTER — Encounter (HOSPITAL_COMMUNITY): Payer: Self-pay | Admitting: Emergency Medicine

## 2015-03-10 DIAGNOSIS — E860 Dehydration: Secondary | ICD-10-CM | POA: Diagnosis present

## 2015-03-10 DIAGNOSIS — I719 Aortic aneurysm of unspecified site, without rupture: Secondary | ICD-10-CM | POA: Diagnosis present

## 2015-03-10 DIAGNOSIS — J9611 Chronic respiratory failure with hypoxia: Secondary | ICD-10-CM | POA: Diagnosis present

## 2015-03-10 DIAGNOSIS — I2781 Cor pulmonale (chronic): Secondary | ICD-10-CM | POA: Diagnosis present

## 2015-03-10 DIAGNOSIS — J449 Chronic obstructive pulmonary disease, unspecified: Secondary | ICD-10-CM | POA: Diagnosis present

## 2015-03-10 DIAGNOSIS — E871 Hypo-osmolality and hyponatremia: Secondary | ICD-10-CM | POA: Diagnosis present

## 2015-03-10 DIAGNOSIS — E222 Syndrome of inappropriate secretion of antidiuretic hormone: Secondary | ICD-10-CM | POA: Diagnosis not present

## 2015-03-10 DIAGNOSIS — K567 Ileus, unspecified: Secondary | ICD-10-CM

## 2015-03-10 DIAGNOSIS — D72829 Elevated white blood cell count, unspecified: Secondary | ICD-10-CM | POA: Diagnosis present

## 2015-03-10 DIAGNOSIS — Z681 Body mass index (BMI) 19 or less, adult: Secondary | ICD-10-CM

## 2015-03-10 DIAGNOSIS — E43 Unspecified severe protein-calorie malnutrition: Secondary | ICD-10-CM | POA: Diagnosis present

## 2015-03-10 DIAGNOSIS — E780 Pure hypercholesterolemia: Secondary | ICD-10-CM | POA: Diagnosis present

## 2015-03-10 DIAGNOSIS — I10 Essential (primary) hypertension: Secondary | ICD-10-CM | POA: Diagnosis present

## 2015-03-10 DIAGNOSIS — R112 Nausea with vomiting, unspecified: Secondary | ICD-10-CM | POA: Diagnosis present

## 2015-03-10 DIAGNOSIS — K5900 Constipation, unspecified: Secondary | ICD-10-CM | POA: Diagnosis present

## 2015-03-10 DIAGNOSIS — F17210 Nicotine dependence, cigarettes, uncomplicated: Secondary | ICD-10-CM | POA: Diagnosis present

## 2015-03-10 DIAGNOSIS — Z9861 Coronary angioplasty status: Secondary | ICD-10-CM

## 2015-03-10 DIAGNOSIS — R52 Pain, unspecified: Secondary | ICD-10-CM

## 2015-03-10 DIAGNOSIS — Z7982 Long term (current) use of aspirin: Secondary | ICD-10-CM

## 2015-03-10 LAB — CBC WITH DIFFERENTIAL/PLATELET
BASOS ABS: 0 10*3/uL (ref 0.0–0.1)
BASOS PCT: 0 % (ref 0–1)
Eosinophils Absolute: 0 10*3/uL (ref 0.0–0.7)
Eosinophils Relative: 0 % (ref 0–5)
HEMATOCRIT: 46.6 % (ref 39.0–52.0)
HEMOGLOBIN: 15.6 g/dL (ref 13.0–17.0)
LYMPHS ABS: 0.9 10*3/uL (ref 0.7–4.0)
Lymphocytes Relative: 7 % — ABNORMAL LOW (ref 12–46)
MCH: 32 pg (ref 26.0–34.0)
MCHC: 33.5 g/dL (ref 30.0–36.0)
MCV: 95.7 fL (ref 78.0–100.0)
MONOS PCT: 7 % (ref 3–12)
Monocytes Absolute: 1 10*3/uL (ref 0.1–1.0)
NEUTROS PCT: 86 % — AB (ref 43–77)
Neutro Abs: 11.1 10*3/uL — ABNORMAL HIGH (ref 1.7–7.7)
Platelets: 384 10*3/uL (ref 150–400)
RBC: 4.87 MIL/uL (ref 4.22–5.81)
RDW: 11.6 % (ref 11.5–15.5)
WBC: 13.1 10*3/uL — AB (ref 4.0–10.5)

## 2015-03-10 NOTE — Telephone Encounter (Signed)
Nurse from [facility] called stating pt is really constipated. She wants him to come to our clinic but he has limited resources on transportation. She did a rectal exam and he has hard stool in the rectum. A previous message was sent to us a couple days ago and pt has not had a bowel movement yet. The nurse would like to know if there was anything he could use OTC to help him with this. She states she will personally go get this and bring it to pt. Please advise.    [phone], Elmarie Shileyiffany

## 2015-03-10 NOTE — ED Provider Notes (Signed)
CSN: [id number]     Arrival date & time 03/10/15  2307 History  This chart was scribed for Shon Baton, MD by Doreatha Martin, ED Scribe. This patient was seen in room A11C/A11C and the patient's care was started at 12:02 AM.     Chief Complaint  Patient presents with  . Constipation  . Emesis   The history is provided by the patient. No language interpreter was used.    HPI Comments: Colton Owens is a 74 y.o. male with a Hx of COPD who presents to the Emergency Department complaining of constipation onset 2 weeks ago. He reports 9/10 cramping abdominal pain, generalized weakness, decreased appetite, difficulty ambulating and HA as associated Sx. He reports that he had a small amount of stool today as a result from the enema given by a home health nurse. He reports that he has been taking stool softeners with no relief. Patient had onset of nonbilious, nonbloody emesis earlier today. Pt is a current smoker on constant 2 L/min home O2. Pt denies Hx of abdominal surgery. He also denies fever, dysuria and hematuria as associated symptoms.   Past Medical History  Diagnosis Date  . Hypertension   . High cholesterol   . Hyponatremia syndrome   . Chronic cor pulmonale   . COPD (chronic obstructive pulmonary disease)   . Aortic aneurysm   . Chronic respiratory failure with hypoxia 03/01/2015   Past Surgical History  Procedure Laterality Date  . Angioplasty    . Left heart catheterization with coronary angiogram N/A 10/23/2012    Procedure: LEFT HEART CATHETERIZATION WITH CORONARY ANGIOGRAM;  Surgeon: [name], MD;  Location: [facility] CATH LAB;  Service: Cardiovascular;  Laterality: N/A;  . Abdominal angiogram N/A 10/23/2012    Procedure: ABDOMINAL ANGIOGRAM;  Surgeon: [name], MD;  Location: [facility] CATH LAB;  Service: Cardiovascular;  Laterality: N/A;   No family history on file. History  Substance Use Topics  . Smoking status: Current Every Day Smoker -- 1.00 packs/day for 58 years     Types: Cigarettes  . Smokeless tobacco: Never Used  . Alcohol Use: No    Review of Systems  Constitutional: Positive for fatigue. Negative for fever.  Respiratory: Negative.  Negative for chest tightness and shortness of breath.   Cardiovascular: Negative.  Negative for chest pain.       Rib pain  Gastrointestinal: Positive for nausea, vomiting, abdominal pain and constipation.  Genitourinary: Negative.  Negative for dysuria.  Musculoskeletal: Negative for back pain.  Neurological: Positive for weakness. Negative for dizziness, numbness and headaches.  All other systems reviewed and are negative.     Allergies  Review of patient's allergies indicates no known allergies.  Home Medications   Prior to Admission medications   Medication Sig Start Date End Date Taking? Authorizing Provider  aspirin EC 81 MG tablet Take 81 mg by mouth daily.   Yes Historical Provider, MD  metoprolol (LOPRESSOR) 50 MG tablet Take 50 mg by mouth 2 (two) times daily.  12/31/14  Yes Historical Provider, MD  acetaminophen (TYLENOL) 325 MG tablet Take 2 tablets (650 mg total) by mouth every 6 (six) hours as needed for mild pain (or Fever >/= 101). Patient not taking: Reported on 03/11/2015 03/01/15   Christiane Ha, MD  cyclobenzaprine (FLEXERIL) 5 MG tablet Take 1 tablet (5 mg total) by mouth 3 (three) times daily as needed for muscle spasms. Patient not taking: Reported on 03/11/2015 03/01/15   Christiane Ha,  MD   BP 140/109 mmHg  Pulse 90  Temp(Src) 97.9 F (36.6 C) (Oral)  Resp 20  Ht 5\' 11"  (1.803 m)  Wt 135 lb (61.236 kg)  BMI 18.84 kg/m2  SpO2 100% Physical Exam  Constitutional: He is oriented to person, place, and time. No distress.  Chronically ill-appearing, smells of smoke  HENT:  Head: Normocephalic and atraumatic.  Mucous membranes dry  Eyes: Pupils are equal, round, and reactive to light.  Cardiovascular: Normal rate, regular rhythm and normal heart sounds.   No murmur  heard. Pulmonary/Chest: Effort normal. No respiratory distress. He has wheezes.  Abdominal: Soft. Bowel sounds are normal. There is tenderness. There is no rebound and no guarding.  Musculoskeletal: He exhibits no edema.  Neurological: He is alert and oriented to person, place, and time.  Skin: Skin is warm and dry.  Psychiatric: He has a normal mood and affect.  Nursing note and vitals reviewed.   ED Course  Procedures (including critical care time) DIAGNOSTIC STUDIES: Oxygen Saturation is 92% on RA, low by my interpretation.    COORDINATION OF CARE: 12:05 AM Discussed treatment plan with pt at bedside and pt agreed to plan.   Labs Review Labs Reviewed  URINALYSIS, ROUTINE W REFLEX MICROSCOPIC - Abnormal; Notable for the following:    Ketones, ur 40 (*)    All other components within normal limits  CBC WITH DIFFERENTIAL/PLATELET - Abnormal; Notable for the following:    WBC 13.1 (*)    Neutrophils Relative % 86 (*)    Neutro Abs 11.1 (*)    Lymphocytes Relative 7 (*)    All other components within normal limits  COMPREHENSIVE METABOLIC PANEL - Abnormal; Notable for the following:    Sodium 123 (*)    Chloride 80 (*)    Glucose, Bld 118 (*)    ALT 13 (*)    All other components within normal limits  LACTIC ACID, PLASMA  LACTIC ACID, PLASMA    Imaging Review Dg Chest 1 View  03/11/2015   CLINICAL DATA:  Shortness of breath for 2 weeks  EXAM: CHEST  1 VIEW  COMPARISON:  Chest CT 02/28/2015  FINDINGS: Hyperinflation and emphysematous change with bronchial wall thickening. There is no edema, consolidation, effusion, or pneumothorax. Normal heart size and mediastinal contours. No acute osseous findings.  IMPRESSION: Emphysema without acute superimposed disease.   Electronically Signed   By: Marnee SpringJonathon  Watts M.D.   On: 03/11/2015 01:19   Ct Abdomen Pelvis W Contrast  03/11/2015   CLINICAL DATA:  Constipation and emesis  EXAM: CT ABDOMEN AND PELVIS WITH CONTRAST  TECHNIQUE:  Multidetector CT imaging of the abdomen and pelvis was performed using the standard protocol following bolus administration of intravenous contrast.  CONTRAST:  25mL OMNIPAQUE IOHEXOL 300 MG/ML SOLN, 100mL OMNIPAQUE IOHEXOL 300 MG/ML SOLN  COMPARISON:  02/28/2015  FINDINGS: BODY WALL: No contributory findings.  LOWER CHEST: Emphysema noted at the lung bases.  No acute findings.  ABDOMEN/PELVIS:  Liver: No focal abnormality.  Biliary: Prominent biliary tree without visible obstructive process. Noted normal bilirubin.  Pancreas: Unremarkable.  Spleen: Unremarkable.  Adrenals: Unremarkable.  Kidneys and ureters: No hydronephrosis or stone. Sub cm cyst in the lower right kidney.  Bladder: Unremarkable.  Reproductive: Mild central prostate enlargement, projecting into the bladder base.  Bowel: Formed stool distends each colonic segment, correlating with the history of constipation. No small bowel obstruction. No inflammatory bowel wall thickening. No pericecal inflammation.  Retroperitoneum: No mass or adenopathy.  Peritoneum:  No ascites or pneumoperitoneum.  Vascular: Advanced atherosclerosis with unchanged 37 mm fusiform infrarenal aortic aneurysm that has thick mural thrombus. There is diffuse narrowing and atheromatous calcification of bilateral external iliac arteries. Stenoses were better evaluated on prior CTA.  OSSEOUS: No acute abnormalities.  IMPRESSION: 1. Extensive stool consistent with history of constipation. No bowel obstruction or inflammatory change. 2. Emphysema. 3. 37 mm infrarenal aortic aneurysm. Recommend followup by ultrasound in 2 years. This recommendation follows ACR consensus guidelines: White Paper of the ACR Incidental Findings Committee II on Vascular Findings. J Am Coll Radiol 2013; 10:789-794.   Electronically Signed   By: Marnee Spring M.D.   On: 03/11/2015 04:15     EKG Interpretation   Date/Time:  Wednesday Mar 11 2015 00:52:18 EDT Ventricular Rate:  80 PR Interval:  119 QRS  Duration: 76 QT Interval:  376 QTC Calculation: 434 R Axis:   -84 Text Interpretation:  Sinus rhythm Borderline short PR interval Consider  right atrial enlargement Left anterior fascicular block Consider right  ventricular hypertrophy Nonspecific T abnormalities, lateral leads No  significant change since last tracing Confirmed by Rashee Marschall  MD, Blayke Pinera  ([id number]) on 03/11/2015 1:06:18 AM      MDM   Final diagnoses:  Constipation, unspecified constipation type  Hyponatremia  Dehydration    Patient presents with generalized weakness, abdominal pain, and symptoms of constipation. Also reports emesis today. Chronically ill-appearing but nontoxic on exam. Clinically appears dry. Patient given a normal saline bolus.  Lab work notable for leukocytosis to 13. Hyponatremia to 123 with hypochloremia. Evidence of dehydration.  Given leukocytosis and vomiting, CT scan of the abdomen obtained to evaluate for small bowel structure. CT scan is negative for obstruction but does show extensive constipation. Patient given an enema. He also has a 37 mm infrarenal aortic aneurysm that needs follow-up. Given hyponatremia and dehydration, feel patient would benefit from admission with IV rehydration and bowel cleanout.  Discussed with Dr. Toniann Fail.  I personally performed the services described in this documentation, which was scribed in my presence. The recorded information has been reviewed and is accurate.   Shon Baton, MD 03/11/15 [phone]

## 2015-03-10 NOTE — ED Notes (Signed)
Pt. reports chronic constipation for several days, mid abdominal cramping  and emesis today . Denies fever or chills.

## 2015-03-11 ENCOUNTER — Emergency Department (HOSPITAL_COMMUNITY): Payer: Medicare Other

## 2015-03-11 ENCOUNTER — Encounter (HOSPITAL_COMMUNITY): Payer: Self-pay

## 2015-03-11 DIAGNOSIS — R112 Nausea with vomiting, unspecified: Secondary | ICD-10-CM | POA: Diagnosis not present

## 2015-03-11 DIAGNOSIS — K5900 Constipation, unspecified: Secondary | ICD-10-CM

## 2015-03-11 DIAGNOSIS — E871 Hypo-osmolality and hyponatremia: Secondary | ICD-10-CM | POA: Diagnosis not present

## 2015-03-11 HISTORY — DX: Constipation, unspecified: K59.00

## 2015-03-11 LAB — BASIC METABOLIC PANEL
ANION GAP: 8 (ref 5–15)
BUN: 8 mg/dL (ref 6–20)
CO2: 31 mmol/L (ref 22–32)
Calcium: 8.4 mg/dL — ABNORMAL LOW (ref 8.9–10.3)
Chloride: 86 mmol/L — ABNORMAL LOW (ref 101–111)
Creatinine, Ser: 0.79 mg/dL (ref 0.61–1.24)
GFR calc Af Amer: >60 mL/min (ref >60)
GFR calc non Af Amer: >60 mL/min (ref >60)
Glucose, Bld: 85 mg/dL (ref 65–99)
POTASSIUM: 4.5 mmol/L (ref 3.5–5.1)
Sodium: 125 mmol/L — ABNORMAL LOW (ref 135–145)

## 2015-03-11 LAB — CBC WITH DIFFERENTIAL/PLATELET
BASOS PCT: 0 % (ref 0–1)
Basophils Absolute: 0 10*3/uL (ref 0.0–0.1)
EOS PCT: 0 % (ref 0–5)
Eosinophils Absolute: 0 10*3/uL (ref 0.0–0.7)
HCT: 40.8 % (ref 39.0–52.0)
Hemoglobin: 13.6 g/dL (ref 13.0–17.0)
LYMPHS ABS: 0.8 10*3/uL (ref 0.7–4.0)
Lymphocytes Relative: 9 % — ABNORMAL LOW (ref 12–46)
MCH: 32.1 pg (ref 26.0–34.0)
MCHC: 33.3 g/dL (ref 30.0–36.0)
MCV: 96.2 fL (ref 78.0–100.0)
Monocytes Absolute: 0.9 10*3/uL (ref 0.1–1.0)
Monocytes Relative: 11 % (ref 3–12)
Neutro Abs: 6.5 10*3/uL (ref 1.7–7.7)
Neutrophils Relative %: 80 % — ABNORMAL HIGH (ref 43–77)
Platelets: 318 10*3/uL (ref 150–400)
RBC: 4.24 MIL/uL (ref 4.22–5.81)
RDW: 11.8 % (ref 11.5–15.5)
WBC: 8.2 10*3/uL (ref 4.0–10.5)

## 2015-03-11 LAB — COMPREHENSIVE METABOLIC PANEL
ALT: 13 U/L — AB (ref 17–63)
ANION GAP: 12 (ref 5–15)
AST: 25 U/L (ref 15–41)
Albumin: 3.5 g/dL (ref 3.5–5.0)
Alkaline Phosphatase: 75 U/L (ref 38–126)
BUN: 11 mg/dL (ref 6–20)
CO2: 31 mmol/L (ref 22–32)
Calcium: 9 mg/dL (ref 8.9–10.3)
Chloride: 80 mmol/L — ABNORMAL LOW (ref 101–111)
Creatinine, Ser: 0.85 mg/dL (ref 0.61–1.24)
GFR calc non Af Amer: >60 mL/min (ref >60)
GLUCOSE: 118 mg/dL — AB (ref 65–99)
POTASSIUM: 4.5 mmol/L (ref 3.5–5.1)
SODIUM: 123 mmol/L — AB (ref 135–145)
Total Bilirubin: 1 mg/dL (ref 0.3–1.2)
Total Protein: 6.8 g/dL (ref 6.5–8.1)

## 2015-03-11 LAB — LACTIC ACID, PLASMA
LACTIC ACID, VENOUS: 1 mmol/L (ref 0.5–2.0)
Lactic Acid, Venous: 1 mmol/L (ref 0.5–2.0)

## 2015-03-11 LAB — OSMOLALITY, URINE: OSMOLALITY UR: 503 mosm/kg (ref 390–1090)

## 2015-03-11 LAB — URINALYSIS, ROUTINE W REFLEX MICROSCOPIC
BILIRUBIN URINE: NEGATIVE
GLUCOSE, UA: NEGATIVE mg/dL
Hgb urine dipstick: NEGATIVE
Ketones, ur: 40 mg/dL — AB
LEUKOCYTES UA: NEGATIVE
NITRITE: NEGATIVE
PROTEIN: NEGATIVE mg/dL
SPECIFIC GRAVITY, URINE: 1.017 (ref 1.005–1.030)
Urobilinogen, UA: 1 mg/dL (ref 0.0–1.0)
pH: 6.5 (ref 5.0–8.0)

## 2015-03-11 LAB — TSH: TSH: 2.212 u[IU]/mL (ref 0.350–4.500)

## 2015-03-11 LAB — SODIUM, URINE, RANDOM: Sodium, Ur: 106 mmol/L

## 2015-03-11 LAB — URIC ACID: Uric Acid, Serum: 3.6 mg/dL — ABNORMAL LOW (ref 4.4–7.6)

## 2015-03-11 LAB — OSMOLALITY: OSMOLALITY: 258 mosm/kg — AB (ref 275–300)

## 2015-03-11 MED ORDER — SODIUM CHLORIDE 0.9 % IV SOLN
INTRAVENOUS | Status: DC
  Administered 2015-03-11: 1000 mL via INTRAVENOUS

## 2015-03-11 MED ORDER — ACETAMINOPHEN 650 MG RE SUPP: 650.0000 mg | Freq: Four times a day (QID) | RECTAL | Status: DC | PRN

## 2015-03-11 MED ORDER — CETYLPYRIDINIUM CHLORIDE 0.05 % MT LIQD
7.0000 mL | Freq: Two times a day (BID) | OROMUCOSAL | Status: DC
  Administered 2015-03-11 – 2015-03-18 (×8): 7 mL via OROMUCOSAL

## 2015-03-11 MED ORDER — SODIUM CHLORIDE 0.9 % IV BOLUS (SEPSIS)
1000.0000 mL | Freq: Once | INTRAVENOUS | Status: AC
  Administered 2015-03-11: 1000 mL via INTRAVENOUS

## 2015-03-11 MED ORDER — ACETAMINOPHEN 325 MG PO TABS
650.0000 mg | ORAL_TABLET | Freq: Four times a day (QID) | ORAL | Status: DC | PRN
  Administered 2015-03-12 – 2015-03-15 (×6): 650 mg via ORAL
  Filled 2015-03-11 (×6): qty 2

## 2015-03-11 MED ORDER — IOHEXOL 300 MG/ML  SOLN
25.0000 mL | Freq: Once | INTRAMUSCULAR | Status: AC | PRN
  Administered 2015-03-11: 25 mL via ORAL

## 2015-03-11 MED ORDER — ONDANSETRON HCL 4 MG PO TABS: 4.0000 mg | ORAL_TABLET | Freq: Four times a day (QID) | ORAL | Status: DC | PRN

## 2015-03-11 MED ORDER — NICOTINE 14 MG/24HR TD PT24
14.0000 mg | MEDICATED_PATCH | Freq: Every day | TRANSDERMAL | Status: DC
  Administered 2015-03-11 – 2015-03-18 (×7): 14 mg via TRANSDERMAL
  Filled 2015-03-11 (×8): qty 1

## 2015-03-11 MED ORDER — METOPROLOL TARTRATE 50 MG PO TABS
50.0000 mg | ORAL_TABLET | Freq: Two times a day (BID) | ORAL | Status: DC
  Administered 2015-03-11 – 2015-03-18 (×13): 50 mg via ORAL
  Filled 2015-03-11 (×16): qty 1

## 2015-03-11 MED ORDER — ENOXAPARIN SODIUM 40 MG/0.4ML ~~LOC~~ SOLN
40.0000 mg | SUBCUTANEOUS | Status: DC
  Administered 2015-03-11 – 2015-03-18 (×7): 40 mg via SUBCUTANEOUS
  Filled 2015-03-11 (×8): qty 0.4

## 2015-03-11 MED ORDER — POLYETHYLENE GLYCOL 3350 17 G PO PACK
17.0000 g | PACK | Freq: Every day | ORAL | Status: DC
  Administered 2015-03-11 – 2015-03-17 (×5): 17 g via ORAL
  Filled 2015-03-11 (×8): qty 1

## 2015-03-11 MED ORDER — SODIUM CHLORIDE 0.9 % IV SOLN
INTRAVENOUS | Status: DC
Start: 2015-03-11 — End: 2015-03-12
  Administered 2015-03-11: 400 mL via INTRAVENOUS
  Administered 2015-03-11: 20:00:00 via INTRAVENOUS
  Administered 2015-03-12: 1000 mL via INTRAVENOUS

## 2015-03-11 MED ORDER — ENSURE ENLIVE PO LIQD
237.0000 mL | Freq: Two times a day (BID) | ORAL | Status: DC
  Administered 2015-03-11: 237 mL via ORAL

## 2015-03-11 MED ORDER — SODIUM CHLORIDE 0.9 % IJ SOLN
3.0000 mL | Freq: Two times a day (BID) | INTRAMUSCULAR | Status: DC
  Administered 2015-03-12 – 2015-03-18 (×6): 3 mL via INTRAVENOUS

## 2015-03-11 MED ORDER — BISACODYL 10 MG RE SUPP
10.0000 mg | Freq: Once | RECTAL | Status: AC
  Administered 2015-03-11: 10 mg via RECTAL
  Filled 2015-03-11: qty 1

## 2015-03-11 MED ORDER — ASPIRIN EC 81 MG PO TBEC
81.0000 mg | DELAYED_RELEASE_TABLET | Freq: Every day | ORAL | Status: DC
Start: 2015-03-11 — End: 2015-03-18
  Administered 2015-03-11 – 2015-03-18 (×7): 81 mg via ORAL
  Filled 2015-03-11 (×8): qty 1

## 2015-03-11 MED ORDER — DOCUSATE SODIUM 100 MG PO CAPS
100.0000 mg | ORAL_CAPSULE | Freq: Two times a day (BID) | ORAL | Status: DC
  Administered 2015-03-11 – 2015-03-17 (×7): 100 mg via ORAL
  Filled 2015-03-11 (×16): qty 1

## 2015-03-11 MED ORDER — BOOST / RESOURCE BREEZE PO LIQD
1.0000 | Freq: Three times a day (TID) | ORAL | Status: DC
  Administered 2015-03-11 (×2): 1 via ORAL

## 2015-03-11 MED ORDER — FLEET ENEMA 7-19 GM/118ML RE ENEM
1.0000 | ENEMA | Freq: Once | RECTAL | Status: DC
  Filled 2015-03-11: qty 1

## 2015-03-11 MED ORDER — ONDANSETRON HCL 4 MG/2ML IJ SOLN
4.0000 mg | Freq: Four times a day (QID) | INTRAMUSCULAR | Status: DC | PRN
  Administered 2015-03-14 – 2015-03-15 (×3): 4 mg via INTRAVENOUS
  Filled 2015-03-11 (×3): qty 2

## 2015-03-11 MED ORDER — IOHEXOL 300 MG/ML  SOLN
100.0000 mL | Freq: Once | INTRAMUSCULAR | Status: AC | PRN
  Administered 2015-03-11: 100 mL via INTRAVENOUS

## 2015-03-11 NOTE — Telephone Encounter (Signed)
Pt hospitalized yesterday for multiple issues including constipation.

## 2015-03-11 NOTE — ED Notes (Signed)
Pt. Back from CT.

## 2015-03-11 NOTE — ED Notes (Signed)
Pt arrives with constipation ongoing for two weeks. States no flatulence. Was able to pass a stool two days ago, but very small and unable to describe. Enema today, but not very successful. Poor historian .

## 2015-03-11 NOTE — Progress Notes (Signed)
Received report from [place], RN in ED to transfer pt to [phone].

## 2015-03-11 NOTE — H&P (Signed)
[facility] History and Physical  SERGIO ZAWISLAK WUJ:[id number] DOB: [birth year] DOA: 03/10/2015  Referring physician: Dr.Horton. PCP: Lucilla Edin, MD  Specialists: None.  Chief Complaint: Nausea vomiting and constipation.  HPI: Colton Owens is a 74 y.o. male was recently admitted for hyponatremia 10 days ago presents to the ER because patient had one episode of nausea vomiting and difficulty moving bowels. Patient states it's almost 2 weeks since he moved his bowels. Patient had called his PCPs office who advised that if he develops nausea vomiting is gone to the ER. Patient developed nausea vomiting multiple episodes denies any blood in it and presented to the ER. In the ER CT abdomen and pelvis shows stool-filled colon. Prior to coming patient did have an enema given by patient's home health aide. Patient's sodium also was found to be low. Patient was given 1 L fluid bolus. Patient was admitted for further management. Prior to arrival to the floor patient had a large bowel movement. Patient still has nausea. Denies any chest pain or shortness of breath.   Review of Systems: As presented in the history of presenting illness, rest negative.  Past Medical History  Diagnosis Date  . Hypertension   . High cholesterol   . Hyponatremia syndrome   . Chronic cor pulmonale   . COPD (chronic obstructive pulmonary disease)   . Aortic aneurysm   . Chronic respiratory failure with hypoxia 03/01/2015   Past Surgical History  Procedure Laterality Date  . Angioplasty    . Left heart catheterization with coronary angiogram N/A 10/23/2012    Procedure: LEFT HEART CATHETERIZATION WITH CORONARY ANGIOGRAM;  Surgeon: [name], MD;  Location: [facility] CATH LAB;  Service: Cardiovascular;  Laterality: N/A;  . Abdominal angiogram N/A 10/23/2012    Procedure: ABDOMINAL ANGIOGRAM;  Surgeon: [name], MD;  Location: [facility] CATH LAB;  Service: Cardiovascular;  Laterality: N/A;   Social History:   reports that he has been smoking Cigarettes.  He has a 58 pack-year smoking history. He has never used smokeless tobacco. He reports that he does not drink alcohol or use illicit drugs. Where does patient live home. Can patient participate in ADLs? Yes.  No Known Allergies  Family History: History reviewed. No pertinent family history.    Prior to Admission medications   Medication Sig Start Date End Date Taking? Authorizing Provider  aspirin EC 81 MG tablet Take 81 mg by mouth daily.   Yes Historical Provider, MD  metoprolol (LOPRESSOR) 50 MG tablet Take 50 mg by mouth 2 (two) times daily.  12/31/14  Yes Historical Provider, MD  acetaminophen (TYLENOL) 325 MG tablet Take 2 tablets (650 mg total) by mouth every 6 (six) hours as needed for mild pain (or Fever >/= 101). Patient not taking: Reported on 03/11/2015 03/01/15   Christiane Ha, MD  cyclobenzaprine (FLEXERIL) 5 MG tablet Take 1 tablet (5 mg total) by mouth 3 (three) times daily as needed for muscle spasms. Patient not taking: Reported on 03/11/2015 03/01/15   Christiane Ha, MD    Physical Exam: Filed Vitals:   03/11/15 0400 03/11/15 0430 03/11/15 0500 03/11/15 0601  BP: 154/79 160/85 166/91 160/82  Pulse: 85 79 87 81  Temp:      TempSrc:      Resp: Height:      Weight:      SpO2: 100% 99% 98% 100%     General:  Moderately built and poorly nourished.  Eyes:  Anicteric no pallor.  ENT: No discharge from the ears eyes nose and mouth.  Neck: No mass felt.  Cardiovascular: S1 and S2 heard.  Respiratory: No rhonchi or crepitations.  Abdomen: Soft nontender bowel sounds present.  Skin: No rash.  Musculoskeletal: Bilateral lower extremity edema. Chronic as per the patient.  Psychiatric: Appears normal.  Neurologic: Alert and oriented to time place and person. Moves all extremities.  Labs on Admission:  Basic Metabolic Panel:  Recent Labs Lab 03/10/15 2324  NA 123*  K 4.5  CL 80*  CO2 31   GLUCOSE 118*  BUN 11  CREATININE 0.85  CALCIUM 9.0   Liver Function Tests:  Recent Labs Lab 03/10/15 2324  AST 25  ALT 13*  ALKPHOS 75  BILITOT 1.0  PROT 6.8  ALBUMIN 3.5   No results for input(s): LIPASE, AMYLASE in the last 168 hours. No results for input(s): AMMONIA in the last 168 hours. CBC:  Recent Labs Lab 03/10/15 2324  WBC 13.1*  NEUTROABS 11.1*  HGB 15.6  HCT 46.6  MCV 95.7  PLT 384   Cardiac Enzymes: No results for input(s): CKTOTAL, CKMB, CKMBINDEX, TROPONINI in the last 168 hours.  BNP (last 3 results) No results for input(s): BNP in the last 8760 hours.  ProBNP (last 3 results) No results for input(s): PROBNP in the last 8760 hours.  CBG: No results for input(s): GLUCAP in the last 168 hours.  Radiological Exams on Admission: Dg Chest 1 View  03/11/2015   CLINICAL DATA:  Shortness of breath for 2 weeks  EXAM: CHEST  1 VIEW  COMPARISON:  Chest CT 02/28/2015  FINDINGS: Hyperinflation and emphysematous change with bronchial wall thickening. There is no edema, consolidation, effusion, or pneumothorax. Normal heart size and mediastinal contours. No acute osseous findings.  IMPRESSION: Emphysema without acute superimposed disease.   Electronically Signed   By: Marnee Spring M.D.   On: 03/11/2015 01:19   Ct Abdomen Pelvis W Contrast  03/11/2015   CLINICAL DATA:  Constipation and emesis  EXAM: CT ABDOMEN AND PELVIS WITH CONTRAST  TECHNIQUE: Multidetector CT imaging of the abdomen and pelvis was performed using the standard protocol following bolus administration of intravenous contrast.  CONTRAST:  25mL OMNIPAQUE IOHEXOL 300 MG/ML SOLN, OMNIPAQUE IOHEXOL 300 MG/ML SOLN  COMPARISON:  02/28/2015  FINDINGS: BODY WALL: No contributory findings.  LOWER CHEST: Emphysema noted at the lung bases.  No acute findings.  ABDOMEN/PELVIS:  Liver: No focal abnormality.  Biliary: Prominent biliary tree without visible obstructive process. Noted normal bilirubin.   Pancreas: Unremarkable.  Spleen: Unremarkable.  Adrenals: Unremarkable.  Kidneys and ureters: No hydronephrosis or stone. Sub cm cyst in the lower right kidney.  Bladder: Unremarkable.  Reproductive: Mild central prostate enlargement, projecting into the bladder base.  Bowel: Formed stool distends each colonic segment, correlating with the history of constipation. No small bowel obstruction. No inflammatory bowel wall thickening. No pericecal inflammation.  Retroperitoneum: No mass or adenopathy.  Peritoneum: No ascites or pneumoperitoneum.  Vascular: Advanced atherosclerosis with unchanged 37 mm fusiform infrarenal aortic aneurysm that has thick mural thrombus. There is diffuse narrowing and atheromatous calcification of bilateral external iliac arteries. Stenoses were better evaluated on prior CTA.  OSSEOUS: No acute abnormalities.  IMPRESSION: 1. Extensive stool consistent with history of constipation. No bowel obstruction or inflammatory change. 2. Emphysema. 3. 37 mm infrarenal aortic aneurysm. Recommend followup by ultrasound in 2 years. This recommendation follows ACR consensus guidelines: White Paper of the ACR Incidental Findings Committee II  on Vascular Findings. J Am Coll Radiol 2013; 10:789-794.   Electronically Signed   By: Marnee SpringJonathon  Watts M.D.   On: 03/11/2015 04:15     Assessment/Plan Principal Problem:   Nausea & vomiting Active Problems:   Hyponatremia   COPD (chronic obstructive pulmonary disease)   CN (constipation)   1. Nausea vomiting with constipation - patient did have 1 large bowel movement prior to coming to the floor. I have ordered Colace 100 mg by mouth twice a day. Check TSH. I have placed patient on clear liquid diet if tolerated advance. 2. Hyponatremia - chronic. Patient did receive 1 L fluid bolus. I will recheck metabolic panel and check uric acid levels and urine sodium and osmolality. Check also TSH. Further recommendations based on the studies ordered and I have not  placed patient on further fluids for now. 3. Hypertension - continue home medications. 4. Abdominal aortic aneurysm the CAT scan, chronic - further workup as outpatient. 5. Tobacco abuse - tobacco cessation counseling. Nicotine patch. 6. Leukocytosis probably reactionary. Follow CBC. Patient is afebrile.   DVT Prophylaxis Lovenox.  Code Status: Full code.  Family Communication: Discussed with patient.  Disposition Plan: Admit for observation.    KAKRAKANDY,ARSHAD N. [facility] Pager [phone].  If 7PM-7AM, please contact night-coverage [email or web address] Password [email or web address] 03/11/2015, 6:07 AM

## 2015-03-11 NOTE — ED Notes (Signed)
Pt. Unable to void at the moment. 

## 2015-03-11 NOTE — Progress Notes (Signed)
Patient seen and examined, admitted by Dr. Toniann FailKakrakandy this morning. Agree with assessment and plan per H&P  vital signs stable,  Severe constipation  continue stool softeners and laxative TSH 2.2   Hyponatremia Continue gentle hydration for now,, obtain serum osmolarity.  Recheck BMET.  Will continue to follow.    Saria Haran M.D. [facility] 03/11/2015, 12:16 PM  Pager: [phone]

## 2015-03-11 NOTE — Progress Notes (Signed)
Initial Nutrition Assessment  DOCUMENTATION CODES:  Severe malnutrition in context of chronic illness  INTERVENTION:  Boost Breeze  NUTRITION DIAGNOSIS:  Malnutrition related to chronic illness, other (see comment) (poor appetite) as evidenced by severe depletion of body fat, severe depletion of muscle mass.  GOAL:  Patient will meet greater than or equal to 90% of their needs  MONITOR:  PO intake, Supplement acceptance, Diet advancement, Labs, Weight trends, Skin, I & O's  REASON FOR ASSESSMENT:  Malnutrition Screening Tool    ASSESSMENT: Colton Owens is a 74 y.o. male was recently admitted for hyponatremia 10 days ago presents to the ER because patient had one episode of nausea vomiting and difficulty moving bowels. Patient states it's almost 2 weeks since he moved his bowels. Patient had called his PCPs office who advised that if he develops nausea vomiting is gone to the ER. Patient developed nausea vomiting multiple episodes denies any blood in it and presented to the ER. In the ER CT abdomen and pelvis shows stool-filled colon. Prior to coming patient did have an enema given by patient's home health aide.   Pt reports general decline in health over the past 2 years. He has progressively lost weight during this time period due to weakness and loss of appetite. He reports that he generally eats a healthy diet at home containing lots of fruits and vegetables, however, food preparation fatigues him to the point where he is too tired to eat. Pt consumed 100% of his clear liquid diet without difficulty.He denies any difficulty chewing and swallowing foods.  He reveals UBW of 160#, which he last weighed around 2 years ago per his report, is consistent with wt hx. He expresses concern with wt loss- wt verified using bedscale.   He denies supplement use at home but is amenable to supplements during hospitalization. Discussed importance of good PO intake to promote healing,    Height:  Ht Readings from Last 1 Encounters:  03/11/15 5\' 11"  (1.803 m)    Weight:  Wt Readings from Last 1 Encounters:  03/11/15 132 lb 9.6 oz (60.147 kg)    Ideal Body Weight:  75.4 kg  Wt Readings from Last 10 Encounters:  03/11/15 132 lb 9.6 oz (60.147 kg)  03/01/13 152 lb 14.4 oz (69.355 kg)  10/23/12 160 lb (72.576 kg)  08/20/12 159 lb 12.8 oz (72.485 kg)    BMI:  Body mass index is 18.5 kg/(m^2).  Estimated Nutritional Needs:  Kcal:  1700-1900  Protein:  70-80 grams  Fluid:  1.7-1.9 L  Skin:  Reviewed, no issues  Diet Order:  Diet clear liquid Room service appropriate?: Yes; Fluid consistency:: Thin; Fluid restriction:: 1800 mL Fluid  EDUCATION NEEDS:  Education needs addressed   Intake/Output Summary (Last 24 hours) at 03/11/15 1417 Last data filed at 03/11/15 1307  Gross per 24 hour  Intake    300 ml  Output   1000 ml  Net   -700 ml    Last BM:  03/11/15  Colton Owens, RD, LDN, CDE Pager: [phone] After hours Pager: [phone]

## 2015-03-12 DIAGNOSIS — D72829 Elevated white blood cell count, unspecified: Secondary | ICD-10-CM | POA: Diagnosis present

## 2015-03-12 DIAGNOSIS — I10 Essential (primary) hypertension: Secondary | ICD-10-CM | POA: Diagnosis present

## 2015-03-12 DIAGNOSIS — E43 Unspecified severe protein-calorie malnutrition: Secondary | ICD-10-CM

## 2015-03-12 DIAGNOSIS — E871 Hypo-osmolality and hyponatremia: Secondary | ICD-10-CM | POA: Diagnosis not present

## 2015-03-12 DIAGNOSIS — E860 Dehydration: Secondary | ICD-10-CM | POA: Diagnosis present

## 2015-03-12 DIAGNOSIS — I2781 Cor pulmonale (chronic): Secondary | ICD-10-CM | POA: Diagnosis present

## 2015-03-12 DIAGNOSIS — I719 Aortic aneurysm of unspecified site, without rupture: Secondary | ICD-10-CM | POA: Diagnosis present

## 2015-03-12 DIAGNOSIS — K5900 Constipation, unspecified: Secondary | ICD-10-CM | POA: Diagnosis present

## 2015-03-12 DIAGNOSIS — Z7982 Long term (current) use of aspirin: Secondary | ICD-10-CM | POA: Diagnosis not present

## 2015-03-12 DIAGNOSIS — Z9861 Coronary angioplasty status: Secondary | ICD-10-CM | POA: Diagnosis not present

## 2015-03-12 DIAGNOSIS — Z681 Body mass index (BMI) 19 or less, adult: Secondary | ICD-10-CM | POA: Diagnosis not present

## 2015-03-12 DIAGNOSIS — J9611 Chronic respiratory failure with hypoxia: Secondary | ICD-10-CM | POA: Diagnosis present

## 2015-03-12 DIAGNOSIS — E222 Syndrome of inappropriate secretion of antidiuretic hormone: Secondary | ICD-10-CM | POA: Diagnosis present

## 2015-03-12 DIAGNOSIS — J449 Chronic obstructive pulmonary disease, unspecified: Secondary | ICD-10-CM | POA: Diagnosis present

## 2015-03-12 DIAGNOSIS — R112 Nausea with vomiting, unspecified: Secondary | ICD-10-CM

## 2015-03-12 DIAGNOSIS — F17210 Nicotine dependence, cigarettes, uncomplicated: Secondary | ICD-10-CM | POA: Diagnosis present

## 2015-03-12 DIAGNOSIS — E780 Pure hypercholesterolemia: Secondary | ICD-10-CM | POA: Diagnosis present

## 2015-03-12 HISTORY — DX: Unspecified severe protein-calorie malnutrition: E43

## 2015-03-12 LAB — BASIC METABOLIC PANEL
Anion gap: 6 (ref 5–15)
BUN: <5 mg/dL — ABNORMAL LOW (ref 6–20)
CALCIUM: 8.2 mg/dL — AB (ref 8.9–10.3)
CHLORIDE: 86 mmol/L — AB (ref 101–111)
CO2: 29 mmol/L (ref 22–32)
Creatinine, Ser: 0.58 mg/dL — ABNORMAL LOW (ref 0.61–1.24)
GFR calc non Af Amer: >60 mL/min (ref >60)
Glucose, Bld: 81 mg/dL (ref 65–99)
Potassium: 4.2 mmol/L (ref 3.5–5.1)
SODIUM: 121 mmol/L — AB (ref 135–145)

## 2015-03-12 MED ORDER — SODIUM CHLORIDE 0.9 % IV SOLN
INTRAVENOUS | Status: DC
  Administered 2015-03-12: 1000 mL via INTRAVENOUS
  Administered 2015-03-13 – 2015-03-16 (×7): via INTRAVENOUS

## 2015-03-12 NOTE — Evaluation (Addendum)
Occupational Therapy Evaluation Patient Details Name: JOEVANNI RODDEY MRN: [id number] DOB: 07-26-1941 Today's Date: 03/12/2015    History of Present Illness HPI: Colton Owens is a 74 y.o. male was recently admitted for hyponatremia presents to the ER on 03/10/15 because patient had one episode of nausea vomiting and difficulty moving bowels (has been 2 weeks)   Clinical Impression   PTA pt lived at home and reports independence with ADLs. Pt is limited by weakness and requires min-Mod A for ADLs and would benefit from acute OT to promote independence. Pt does not have 24/7 Supervision and feel that SNF would be safest d/c venue. However, if pt is ineligible or declines, will need HHOT and HHaide.     Follow Up Recommendations  SNF;Supervision/Assistance - 24 hour (or HHOT, HHaide if not SNF eligible)    Equipment Recommendations  3 in 1 bedside comode;Tub/shower bench    Recommendations for Other Services       Precautions / Restrictions Precautions Precautions: Fall Precaution Comments: needs RW, likely O2 per clinical presentation Restrictions Weight Bearing Restrictions: No      Mobility Bed Mobility               General bed mobility comments: pt up in recliner  Transfers Overall transfer level: Needs assistance Equipment used: Rolling walker (2 wheeled);1 person hand held assist Transfers: Sit to/from Stand Sit to Stand: Min assist         General transfer comment: Min A to rise to stand due to weakness.         ADL Overall ADL's : Needs assistance/impaired Eating/Feeding: Independent;Sitting   Grooming: Set up;Sitting   Upper Body Bathing: Set up;Sitting   Lower Body Bathing: Minimal assistance;Sit to/from stand   Upper Body Dressing : Set up;Sitting   Lower Body Dressing: Minimal assistance;Sit to/from stand   Toilet Transfer: Minimal assistance;Ambulation;RW           Functional mobility during ADLs: Minimal assistance;Rolling  walker General ADL Comments: Pt limited by weakness     Vision Additional Comments: No change from baseline   Perception     Praxis      Pertinent Vitals/Pain Pain Assessment: No/denies pain     Hand Dominance Right   Extremity/Trunk Assessment Upper Extremity Assessment Upper Extremity Assessment: Generalized weakness   Lower Extremity Assessment Lower Extremity Assessment: Generalized weakness   Cervical / Trunk Assessment Cervical / Trunk Assessment: Kyphotic   Communication Communication Communication: No difficulties   Cognition Arousal/Alertness: Awake/alert Behavior During Therapy: WFL for tasks assessed/performed Overall Cognitive Status: Within Functional Limits for tasks assessed                                Home Living Family/patient expects to be discharged to:: Private residence Living Arrangements: Alone Available Help at Discharge: Neighbor;Family;Available PRN/intermittently Type of Home: Mobile home Home Access: Stairs to enter Entrance Stairs-Number of Steps: 4 Entrance Stairs-Rails: Left Home Layout: One level     Bathroom Shower/Tub: Chief Strategy Officer: Standard     Home Equipment: None          Prior Functioning/Environment Level of Independence: Independent             OT Diagnosis: Generalized weakness   OT Problem List: Decreased strength;Decreased activity tolerance;Impaired balance (sitting and/or standing)   OT Treatment/Interventions: Self-care/ADL training;Therapeutic exercise;Energy conservation;DME and/or AE instruction;Therapeutic activities;Patient/family education;Balance training    OT Goals(Current  goals can be found in the care plan section) Acute Rehab OT Goals Patient Stated Goal: feel better OT Goal Formulation: With patient Time For Goal Achievement: 03/26/15 Potential to Achieve Goals: Good ADL Goals Pt Will Perform Grooming: with supervision;standing Pt Will Perform  Lower Body Bathing: with supervision;sit to/from stand Pt Will Perform Lower Body Dressing: with supervision;sit to/from stand Pt Will Transfer to Toilet: with supervision;ambulating Pt Will Perform Toileting - Clothing Manipulation and hygiene: with supervision;sit to/from stand  OT Frequency: Min 2X/week   Barriers to D/C: Decreased caregiver support             End of Session Equipment Utilized During Treatment: Gait belt;Rolling walker;Oxygen  Activity Tolerance: Patient tolerated treatment well Patient left: in chair;with call bell/phone within reach;with chair alarm set   Time: 1136-1150 OT Time Calculation (min): 14 min Charges:  OT General Charges $OT Visit: 1 Procedure OT Evaluation $Initial OT Evaluation Tier I: 1 Procedure G-Codes: OT G-codes **NOT FOR INPATIENT CLASS** Functional Assessment Tool Used: clinical judgement Functional Limitation: Self care Self Care Current Status (Z6109(G8987): At least 20 percent but less than 40 percent impaired, limited or restricted Self Care Goal Status (U0454(G8988): At least 1 percent but less than 20 percent impaired, limited or restricted  Rae LipsMiller, Cleota Pellerito M 03/12/2015, 2:03 PM  Carney LivingLeeAnn Marie Bruna Dills, OTR/L Occupational Therapist [phone] (pager)

## 2015-03-12 NOTE — Progress Notes (Signed)
- PROGRESS NOTE  Colton Owens ZOX:[id number] DOB: 1941/03/11 DOA: 03/10/2015 PCP: Lucilla Edin, MD  Assessment/Plan: 1. Nausea vomiting with constipation -had large BM -advanced diet 2. Hyponatremia - chronic (around 129). ?SIADH per urine and serum osmos- IVF and lfuid restrict orally 3. Hypertension - continue home medications. 4. Abdominal aortic aneurysm the CAT scan, chronic - further workup as outpatient. 5. Tobacco abuse - tobacco cessation counseling. Nicotine patch. 6. Leukocytosis probably reactionary. Follow CBC. Patient is afebrile.  Code Status: full Family Communication: patient Disposition Plan: SNF?   Consultants:    Procedures:      HPI/Subjective: C/o liquid diet- wants some "real" food  Objective: Filed Vitals:   03/12/15 1430  BP: 113/73  Pulse:   Temp: 98.5 F (36.9 C)  Resp: 18    Intake/Output Summary (Last 24 hours) at 03/12/15 1447 Last data filed at 03/12/15 1009  Gross per 24 hour  Intake 2073.75 ml  Output   2025 ml  Net  48.75 ml   Filed Weights   03/10/15 2319 03/11/15 0559 03/11/15 1246  Weight: 61.236 kg (135 lb) 57.1 kg (125 lb 14.1 oz) 60.147 kg (132 lb 9.6 oz)    Exam:   General: pleasant  Cardiovascular: rrr  Respiratory: clear  Abdomen: +BS, soft  Musculoskeletal: no edema   Data Reviewed: Basic Metabolic Panel:  Recent Labs Lab 03/10/15 2324 03/11/15 0847 03/12/15 0527  NA 123* 125* 121*  K 4.5 4.5 4.2  CL 80* 86* 86*  CO2 GLUCOSE 118* 85 81  BUN 11 8 <5*  CREATININE 0.85 0.79 0.58*  CALCIUM 9.0 8.4* 8.2*   Liver Function Tests:  Recent Labs Lab 03/10/15 2324  AST 25  ALT 13*  ALKPHOS 75  BILITOT 1.0  PROT 6.8  ALBUMIN 3.5   No results for input(s): LIPASE, AMYLASE in the last 168 hours. No results for input(s): AMMONIA in the last 168 hours. CBC:  Recent Labs Lab 03/10/15 2324 03/11/15 0847  WBC 13.1* 8.2  NEUTROABS 11.1* 6.5  HGB 15.6 13.6  HCT 46.6 40.8  MCV  95.7 96.2  PLT 384 318   Cardiac Enzymes: No results for input(s): CKTOTAL, CKMB, CKMBINDEX, TROPONINI in the last 168 hours. BNP (last 3 results) No results for input(s): BNP in the last 8760 hours.  ProBNP (last 3 results) No results for input(s): PROBNP in the last 8760 hours.  CBG: No results for input(s): GLUCAP in the last 168 hours.  No results found for this or any previous visit (from the past 240 hour(s)).   Studies: Dg Chest 1 View  03/11/2015   CLINICAL DATA:  Shortness of breath for 2 weeks  EXAM: CHEST  1 VIEW  COMPARISON:  Chest CT 02/28/2015  FINDINGS: Hyperinflation and emphysematous change with bronchial wall thickening. There is no edema, consolidation, effusion, or pneumothorax. Normal heart size and mediastinal contours. No acute osseous findings.  IMPRESSION: Emphysema without acute superimposed disease.   Electronically Signed   By: Marnee Spring M.D.   On: 03/11/2015 01:19   Ct Abdomen Pelvis W Contrast  03/11/2015   CLINICAL DATA:  Constipation and emesis  EXAM: CT ABDOMEN AND PELVIS WITH CONTRAST  TECHNIQUE: Multidetector CT imaging of the abdomen and pelvis was performed using the standard protocol following bolus administration of intravenous contrast.  CONTRAST:  25mL OMNIPAQUE IOHEXOL 300 MG/ML SOLN, OMNIPAQUE IOHEXOL 300 MG/ML SOLN  COMPARISON:  02/28/2015  FINDINGS: BODY WALL: No contributory findings.  LOWER CHEST:  Emphysema noted at the lung bases.  No acute findings.  ABDOMEN/PELVIS:  Liver: No focal abnormality.  Biliary: Prominent biliary tree without visible obstructive process. Noted normal bilirubin.  Pancreas: Unremarkable.  Spleen: Unremarkable.  Adrenals: Unremarkable.  Kidneys and ureters: No hydronephrosis or stone. Sub cm cyst in the lower right kidney.  Bladder: Unremarkable.  Reproductive: Mild central prostate enlargement, projecting into the bladder base.  Bowel: Formed stool distends each colonic segment, correlating with the history of  constipation. No small bowel obstruction. No inflammatory bowel wall thickening. No pericecal inflammation.  Retroperitoneum: No mass or adenopathy.  Peritoneum: No ascites or pneumoperitoneum.  Vascular: Advanced atherosclerosis with unchanged 37 mm fusiform infrarenal aortic aneurysm that has thick mural thrombus. There is diffuse narrowing and atheromatous calcification of bilateral external iliac arteries. Stenoses were better evaluated on prior CTA.  OSSEOUS: No acute abnormalities.  IMPRESSION: 1. Extensive stool consistent with history of constipation. No bowel obstruction or inflammatory change. 2. Emphysema. 3. 37 mm infrarenal aortic aneurysm. Recommend followup by ultrasound in 2 years. This recommendation follows ACR consensus guidelines: White Paper of the ACR Incidental Findings Committee II on Vascular Findings. J Am Coll Radiol 2013; 10:789-794.   Electronically Signed   By: Marnee SpringJonathon  Watts M.D.   On: 03/11/2015 04:15    Scheduled Meds: . antiseptic oral rinse  7 mL Mouth Rinse BID  . aspirin EC  81 mg Oral Daily  . docusate sodium  100 mg Oral BID  . enoxaparin (LOVENOX) injection  40 mg Subcutaneous Q24H  . feeding supplement (RESOURCE BREEZE)  1 Container Oral TID BM  . metoprolol  50 mg Oral BID  . nicotine  14 mg Transdermal Daily  . polyethylene glycol  17 g Oral Daily  . sodium chloride  3 mL Intravenous Q12H   Continuous Infusions: . sodium chloride     Antibiotics Given (last 72 hours)    None      Principal Problem:   Nausea & vomiting Active Problems:   Hyponatremia   COPD (chronic obstructive pulmonary disease)   CN (constipation)   Protein-calorie malnutrition, severe    Time spent: 25 min    Fleeta Kunde  [facility] Pager [phone]. If 7PM-7AM, please contact night-coverage at [email or web address], password [email or web address] 03/12/2015, 2:47 PM  LOS: 1 day

## 2015-03-12 NOTE — Evaluation (Signed)
Physical Therapy Evaluation Patient Details Name: Colton Owens MRN: [id number] DOB: [birth year] Today's Date: 03/12/2015   History of Present Illness  HPI: Colton Owens is a 74 y.o. male was recently admitted for hyponatremia 10 days ago presents to the ER because patient had one episode of nausea vomiting and difficulty moving bowels (has been 2 weeks)  Clinical Impression   Pt admitted with above diagnosis. Pt currently with functional limitations due to the deficits listed below (see PT Problem List).  Pt will benefit from skilled PT to increase their independence and safety with mobility to allow discharge to the venue listed below.       Follow Up Recommendations Home health PT (pt is Obs status; it is unlikely insurance will cover SNF)  HHPT/OT/Aide, RN for chronic disease management; perhaps HHSW as he lives alone and is reporting difficulty managing; Given difficulty, it is likley time to discuss more manageable long-term options    Equipment Recommendations  Rolling walker with 5" wheels (it seems like he doesn't have one despite rec last adm)    Recommendations for Other Services OT consult;Other (comment) (Case Mgmnt, SW, for Meals on Wheels, options for more help in the home)     Precautions / Restrictions Precautions Precautions: Fall Precaution Comments: needs RW, likely O2 per clinical presentation      Mobility  Bed Mobility                  Transfers Overall transfer level: Needs assistance Equipment used: Rolling walker (2 wheeled);1 person hand held assist Transfers: Sit to/from Stand Sit to Stand: Min assist         General transfer comment: safety concerns due to report of weakness; assist/cues to slow descent  Ambulation/Gait Ambulation/Gait assistance: Min guard Ambulation Distance (Feet): 120 Feet Assistive device: Rolling walker (2 wheeled) Gait Pattern/deviations: Shuffle;Trunk flexed Gait velocity: decr   General Gait Details:  Cues for RW use and posture; walked on supplemental O2 2 L with apparentl less dyspnea than PT session 2 weeks ago  Information systems manager [employer] (Stroke Patients Only)       Balance     Sitting balance-Leahy Scale: Good       Standing balance-Leahy Scale: Poor                               Pertinent Vitals/Pain Pain Assessment: No/denies pain    Home Living Family/patient expects to be discharged to:: Private residence Living Arrangements: Alone Available Help at Discharge: Neighbor;Family (they check on pt) Type of Home: Mobile home Home Access: Stairs to enter Entrance Stairs-Rails: Left Entrance Stairs-Number of Steps: 4 Home Layout: One level Home Equipment: None      Prior Function Level of Independence: Independent               Hand Dominance   Dominant Hand: Right    Extremity/Trunk Assessment                         Communication   Communication: No difficulties  Cognition                            General Comments      Exercises        Assessment/Plan  PT Assessment Patient needs continued PT services  PT Diagnosis Difficulty walking   PT Problem List Decreased skin integrity;Cardiopulmonary status limiting activity;Decreased knowledge of use of DME;Decreased mobility;Decreased balance;Decreased activity tolerance;Decreased strength;Decreased range of motion  PT Treatment Interventions DME instruction;Gait training;Stair training;Functional mobility training;Therapeutic activities;Therapeutic exercise;Balance training;Patient/family education   PT Goals (Current goals can be found in the Care Plan section) Acute Rehab PT Goals Patient Stated Goal: feel better PT Goal Formulation: With patient Time For Goal Achievement: 03/19/15 Potential to Achieve Goals: Good    Frequency Min 3X/week   Barriers to discharge Decreased caregiver support;Inaccessible home  environment REports he needs more help at home    Co-evaluation               End of Session Equipment Utilized During Treatment: Gait belt;Oxygen Activity Tolerance: Patient tolerated treatment well Patient left: in chair;with call bell/phone within reach Nurse Communication: Mobility status    Functional Assessment Tool Used: Clinical Judgement Functional Limitation: Mobility: Walking and moving around Mobility: Walking and Moving Around Current Status (U9811(G8978): At least 1 percent but less than 20 percent impaired, limited or restricted Mobility: Walking and Moving Around Goal Status [phone](G8979): 0 percent impaired, limited or restricted    Time: 1020 (these times are approximate)-1045 PT Time Calculation (min) (ACUTE ONLY): 25 min   Charges:   PT Evaluation $Initial PT Evaluation Tier I: 1 Procedure PT Treatments $Gait Training: 8-22 mins   PT G Codes:   PT G-Codes **NOT FOR INPATIENT CLASS** Functional Assessment Tool Used: Clinical Judgement Functional Limitation: Mobility: Walking and moving around Mobility: Walking and Moving Around Current Status (G9562(G8978): At least 1 percent but less than 20 percent impaired, limited or restricted Mobility: Walking and Moving Around Goal Status [phone](G8979): 0 percent impaired, limited or restricted    Van ClinesGarrigan, Shaquavia Whisonant [facility] Hospital[facility] 03/12/2015, 11:35 AM  Van ClinesHolly Omkar Stratmann, PT  Acute Rehabilitation Services Pager [phone] Office [phone]

## 2015-03-13 LAB — BASIC METABOLIC PANEL
ANION GAP: 6 (ref 5–15)
CO2: 31 mmol/L (ref 22–32)
Calcium: 7.8 mg/dL — ABNORMAL LOW (ref 8.9–10.3)
Chloride: 84 mmol/L — ABNORMAL LOW (ref 101–111)
Creatinine, Ser: 0.64 mg/dL (ref 0.61–1.24)
GFR calc Af Amer: >60 mL/min (ref >60)
GFR calc non Af Amer: >60 mL/min (ref >60)
GLUCOSE: 79 mg/dL (ref 65–99)
POTASSIUM: 3.8 mmol/L (ref 3.5–5.1)
SODIUM: 121 mmol/L — AB (ref 135–145)

## 2015-03-13 MED ORDER — ALUM & MAG HYDROXIDE-SIMETH 200-200-20 MG/5ML PO SUSP
15.0000 mL | Freq: Four times a day (QID) | ORAL | Status: DC | PRN
  Administered 2015-03-13 – 2015-03-18 (×8): 15 mL via ORAL
  Filled 2015-03-13 (×9): qty 30

## 2015-03-13 NOTE — Progress Notes (Signed)
PROGRESS NOTE  Colton Owens ZOX:[id number] DOB: 05/05/1941 DOA: 03/10/2015 PCP: Lucilla EdinAUB, STEVE A, MD  Assessment/Plan: 1. Nausea vomiting with constipation -had large BM -advanced diet and tolerating well 2. Hyponatremia - chronic (around 129). 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 Hypertension - continue home medications. 4. Abdominal aortic aneurysm the CAT scan, chronic - further workup as outpatient. 5. Tobacco abuse - tobacco cessation counseling. Nicotine patch. 6. Leukocytosis probably reactionary. Follow CBC. Patient is afebrile. 7. Chronic resp failure- on 2L O2  Code Status: full Family Communication: patient Disposition Plan: SNF   Consultants:    Procedures:      HPI/Subjective: Bowel moving better No SOB  Objective: Filed Vitals:   03/13/15 0545  BP: 145/82  Pulse: 67  Temp: 98.2 F (36.8 C)  Resp: 18    Intake/Output Summary (Last 24 hours) at 03/13/15 1007 Last data filed at 03/13/15 0851  Gross per 24 hour  Intake 1981.67 ml  Output   1150 ml  Net 831.67 ml   Filed Weights   03/10/15 2319 03/11/15 0559 03/11/15 1246  Weight: 61.236 kg (135 lb) 57.1 kg (125 lb 14.1 oz) 60.147 kg (132 lb 9.6 oz)    Exam:   General: pleasant  Cardiovascular: rrr  Respiratory: clear  Abdomen: +BS, soft  Musculoskeletal: no edema   Data Reviewed: Basic Metabolic Panel:  Recent Labs Lab 03/10/15 2324 03/11/15 0847 03/12/15 0527 03/13/15 0600  NA 123* 125* 121* 121*  K 4.5 4.5 4.2 3.8  CL 80* 86* 86* 84*  CO2 31 31 29 31   GLUCOSE 118* 85 81 79  BUN 11 8 <5* <5*  CREATININE 0.85 0.79 0.58* 0.64  CALCIUM 9.0 8.4* 8.2* 7.8*   Liver Function Tests:  Recent Labs Lab 03/10/15 2324  AST 25  ALT 13*  ALKPHOS 75  BILITOT 1.0  PROT 6.8  ALBUMIN 3.5   No results for input(s): LIPASE, AMYLASE in the last 168 hours. No results for input(s): AMMONIA in the last 168 hours. CBC:  Recent Labs Lab  03/10/15 2324 03/11/15 0847  WBC 13.1* 8.2  NEUTROABS 11.1* 6.5  HGB 15.6 13.6  HCT 46.6 40.8  MCV 95.7 96.2  PLT 384 318   Cardiac Enzymes: No results for input(s): CKTOTAL, CKMB, CKMBINDEX, TROPONINI in the last 168 hours. BNP (last 3 results) No results for input(s): BNP in the last 8760 hours.  ProBNP (last 3 results) No results for input(s): PROBNP in the last 8760 hours.  CBG: No results for input(s): GLUCAP in the last 168 hours.  No results found for this or any previous visit (from the past 240 hour(s)).   Studies: No results found.  Scheduled Meds: . antiseptic oral rinse  7 mL Mouth Rinse BID  . aspirin EC  81 mg Oral Daily  . docusate sodium  100 mg Oral BID  . enoxaparin (LOVENOX) injection  40 mg Subcutaneous Q24H  . feeding supplement (RESOURCE BREEZE)  1 Container Oral TID BM  . metoprolol  50 mg Oral BID  . nicotine  14 mg Transdermal Daily  . polyethylene glycol  17 g Oral Daily  . sodium chloride  3 mL Intravenous Q12H   Continuous Infusions: . sodium chloride 125 mL/hr at 03/13/15 0201   Antibiotics Given (last 72 hours)    None      Principal Problem:   Nausea & vomiting Active Problems:   Hyponatremia   COPD (chronic obstructive pulmonary disease)  CN (constipation)   Protein-calorie malnutrition, severe    Time spent: 25 min    VANN, JESSICA  [facility] Pager [phone]. If 7PM-7AM, please contact night-coverage at [email or web address], password [facility] 03/13/2015, 10:07 AM  LOS: 2 days

## 2015-03-13 NOTE — Care Management Note (Signed)
Case Management Note  Patient Details  Name: Colton Owens Swallows MRN: [id number] Date of Birth: 1941-04-02  Subjective/Objective:   Patient lives alone, patient will need snf at dc, CSW aware.                Action/Plan:   Expected Discharge Date:                  Expected Discharge Plan:  Skilled Nursing Facility  In-House Referral:  Clinical Social Work  Discharge planning Services  CM Consult  Post Acute Care Choice:    Choice offered to:     DME Arranged:    DME Agency:     HH Arranged:    HH Agency:     Status of Service:  In process, will continue to follow  Medicare Important Message Given:  Yes Date Medicare IM Given:  03/13/15 Medicare IM give by:  Letha Capeeborah Tayvon Culley RN Date Additional Medicare IM Given:    Additional Medicare Important Message give by:     If discussed at Long Length of Stay Meetings, dates discussed:    Additional Comments:  Leone Havenaylor, Kaliann Coryell Clinton, RN 03/13/2015, 11:19 AM

## 2015-03-13 NOTE — Progress Notes (Signed)
Pt refused Breeze and would like to have ensure instead. Chocolate ensure given.

## 2015-03-14 LAB — BASIC METABOLIC PANEL
Anion gap: 5 (ref 5–15)
BUN: 7 mg/dL (ref 6–20)
CHLORIDE: 89 mmol/L — AB (ref 101–111)
CO2: 30 mmol/L (ref 22–32)
CREATININE: 0.44 mg/dL — AB (ref 0.61–1.24)
Calcium: 8.2 mg/dL — ABNORMAL LOW (ref 8.9–10.3)
GFR calc Af Amer: >60 mL/min (ref >60)
GLUCOSE: 94 mg/dL (ref 65–99)
Potassium: 4.3 mmol/L (ref 3.5–5.1)
Sodium: 124 mmol/L — ABNORMAL LOW (ref 135–145)

## 2015-03-14 MED ORDER — SIMETHICONE 80 MG PO CHEW
80.0000 mg | CHEWABLE_TABLET | Freq: Four times a day (QID) | ORAL | Status: DC | PRN
  Administered 2015-03-14: 80 mg via ORAL
  Filled 2015-03-14 (×2): qty 1

## 2015-03-14 MED ORDER — PROMETHAZINE HCL 25 MG/ML IJ SOLN
12.5000 mg | Freq: Once | INTRAMUSCULAR | Status: AC
  Administered 2015-03-14: 12.5 mg via INTRAVENOUS
  Filled 2015-03-14: qty 1

## 2015-03-14 MED ORDER — HYDRALAZINE HCL 20 MG/ML IJ SOLN
10.0000 mg | Freq: Four times a day (QID) | INTRAMUSCULAR | Status: DC | PRN
  Administered 2015-03-15: 10 mg via INTRAVENOUS
  Filled 2015-03-14: qty 1

## 2015-03-14 MED ORDER — LACTULOSE 10 GM/15ML PO SOLN
30.0000 g | Freq: Two times a day (BID) | ORAL | Status: DC
  Administered 2015-03-14 – 2015-03-17 (×5): 30 g via ORAL
  Filled 2015-03-14 (×10): qty 45

## 2015-03-14 MED ORDER — BISACODYL 10 MG RE SUPP
10.0000 mg | Freq: Once | RECTAL | Status: AC
  Administered 2015-03-14: 10 mg via RECTAL
  Filled 2015-03-14: qty 1

## 2015-03-14 MED ORDER — PANTOPRAZOLE SODIUM 40 MG PO TBEC
40.0000 mg | DELAYED_RELEASE_TABLET | Freq: Every day | ORAL | Status: DC
  Administered 2015-03-14 – 2015-03-18 (×4): 40 mg via ORAL
  Filled 2015-03-14 (×4): qty 1

## 2015-03-14 NOTE — Progress Notes (Signed)
PROGRESS NOTE  Colton Owens WUJ:[id number] DOB: 07-19-1941 DOA: 03/10/2015 PCP: Lucilla Edin, MD  Assessment/Plan: 1. Nausea vomiting with constipation -had large BM -more meds added as patient has gone 2 days with out BM again 2. Hyponatremia - chronic (around 129). 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 Hypertension - continue home medications. 4. Abdominal aortic aneurysm the CAT scan, chronic - further workup as outpatient. 5. Tobacco abuse - tobacco cessation counseling. Nicotine patch. 6. Leukocytosis probably reactionary. Follow CBC. Patient is afebrile. 7. Chronic resp failure- on 2L O2  Code Status: full Family Communication: patient Disposition Plan: SNF   Consultants:    Procedures:      HPI/Subjective: C/o some fullness in epigastric region  Objective: Filed Vitals:   03/14/15 0540  BP: 141/84  Pulse: 65  Temp: 98.1 F (36.7 C)  Resp: 18    Intake/Output Summary (Last 24 hours) at 03/14/15 0929 Last data filed at 03/14/15 9562  Gross per 24 hour  Intake 2499.17 ml  Output   2145 ml  Net 354.17 ml   Filed Weights   03/10/15 2319 03/11/15 0559 03/11/15 1246  Weight: 61.236 kg (135 lb) 57.1 kg (125 lb 14.1 oz) 60.147 kg (132 lb 9.6 oz)    Exam:   General: pleasant  Cardiovascular: rrr  Respiratory: clear  Abdomen: +BS, soft  Musculoskeletal: no edema   Data Reviewed: Basic Metabolic Panel:  Recent Labs Lab 03/10/15 2324 03/11/15 0847 03/12/15 0527 03/13/15 0600 03/14/15 0432  NA 123* 125* 121* 121* 124*  K 4.5 4.5 4.2 3.8 4.3  CL 80* 86* 86* 84* 89*  CO2 GLUCOSE 118* 85 81 79 94  BUN 11 8 <5* <5* 7  CREATININE 0.85 0.79 0.58* 0.64 0.44*  CALCIUM 9.0 8.4* 8.2* 7.8* 8.2*   Liver Function Tests:  Recent Labs Lab 03/10/15 2324  AST 25  ALT 13*  ALKPHOS 75  BILITOT 1.0  PROT 6.8  ALBUMIN 3.5   No results for input(s): LIPASE, AMYLASE in the last 168 hours. No  results for input(s): AMMONIA in the last 168 hours. CBC:  Recent Labs Lab 03/10/15 2324 03/11/15 0847  WBC 13.1* 8.2  NEUTROABS 11.1* 6.5  HGB 15.6 13.6  HCT 46.6 40.8  MCV 95.7 96.2  PLT 384 318   Cardiac Enzymes: No results for input(s): CKTOTAL, CKMB, CKMBINDEX, TROPONINI in the last 168 hours. BNP (last 3 results) No results for input(s): BNP in the last 8760 hours.  ProBNP (last 3 results) No results for input(s): PROBNP in the last 8760 hours.  CBG: No results for input(s): GLUCAP in the last 168 hours.  No results found for this or any previous visit (from the past 240 hour(s)).   Studies: No results found.  Scheduled Meds: . antiseptic oral rinse  7 mL Mouth Rinse BID  . aspirin EC  81 mg Oral Daily  . bisacodyl  10 mg Rectal Once  . docusate sodium  100 mg Oral BID  . enoxaparin (LOVENOX) injection  40 mg Subcutaneous Q24H  . feeding supplement (RESOURCE BREEZE)  1 Container Oral TID BM  . lactulose  30 g Oral BID  . metoprolol  50 mg Oral BID  . nicotine  14 mg Transdermal Daily  . polyethylene glycol  17 g Oral Daily  . sodium chloride  3 mL Intravenous Q12H   Continuous Infusions: . sodium chloride 125 mL/hr at 03/14/15 0247  Antibiotics Given (last 72 hours)    None      Principal Problem:   Nausea & vomiting Active Problems:   Hyponatremia   COPD (chronic obstructive pulmonary disease)   CN (constipation)   Protein-calorie malnutrition, severe    Time spent: 25 min    Raunel Dimartino  [facility] Pager [phone]. If 7PM-7AM, please contact night-coverage at [email or web address], password [email or web address] 03/14/2015, 9:29 AM  LOS: 3 days

## 2015-03-14 NOTE — Progress Notes (Signed)
Pt's actively vomiting, still complaining of nausea. Claiborne Billingsallahan NP paged and notified that Zofran not due yet. Pt requesting something for nausea and vomiting.    Claiborne Billingsallahan NP called back and placed order for Phenergan IV 12.5 mg one time dose.

## 2015-03-15 ENCOUNTER — Inpatient Hospital Stay (HOSPITAL_COMMUNITY): Payer: Medicare Other

## 2015-03-15 LAB — GLUCOSE, CAPILLARY
GLUCOSE-CAPILLARY: 98 mg/dL (ref 65–99)
GLUCOSE-CAPILLARY: 90 mg/dL (ref 65–99)

## 2015-03-15 LAB — BASIC METABOLIC PANEL
ANION GAP: 7 (ref 5–15)
BUN: 6 mg/dL (ref 6–20)
CO2: 30 mmol/L (ref 22–32)
Calcium: 8 mg/dL — ABNORMAL LOW (ref 8.9–10.3)
Chloride: 86 mmol/L — ABNORMAL LOW (ref 101–111)
Creatinine, Ser: 0.63 mg/dL (ref 0.61–1.24)
GLUCOSE: 95 mg/dL (ref 65–99)
Potassium: 4.3 mmol/L (ref 3.5–5.1)
Sodium: 123 mmol/L — ABNORMAL LOW (ref 135–145)

## 2015-03-15 MED ORDER — MORPHINE SULFATE 2 MG/ML IJ SOLN
1.0000 mg | INTRAMUSCULAR | Status: DC | PRN
  Administered 2015-03-15 – 2015-03-18 (×10): 1 mg via INTRAVENOUS
  Filled 2015-03-15 (×10): qty 1

## 2015-03-15 MED ORDER — ZOLPIDEM TARTRATE 5 MG PO TABS
5.0000 mg | ORAL_TABLET | Freq: Once | ORAL | Status: AC
  Administered 2015-03-15: 5 mg via ORAL
  Filled 2015-03-15: qty 1

## 2015-03-15 MED ORDER — METOCLOPRAMIDE HCL 5 MG/ML IJ SOLN
5.0000 mg | Freq: Three times a day (TID) | INTRAMUSCULAR | Status: DC
  Administered 2015-03-15 – 2015-03-18 (×9): 5 mg via INTRAVENOUS
  Filled 2015-03-15 (×12): qty 1

## 2015-03-15 MED ORDER — BISACODYL 10 MG RE SUPP
10.0000 mg | Freq: Every day | RECTAL | Status: DC | PRN
  Filled 2015-03-15: qty 1

## 2015-03-15 NOTE — Progress Notes (Signed)
Pt refused Ducolax suppository. Pt would like to take it tomorrow morning.

## 2015-03-15 NOTE — Progress Notes (Signed)
Pt complains of pain 5/10 on right rib cage, stated that pain is from his rib cage, not from the abdomen. Pt currently resting. Previous nausea relieved from IV Phenergan 12.5 mg one time dose. Will continue to monitor.

## 2015-03-15 NOTE — Clinical Social Work Note (Signed)
Clinical Social Work Assessment  Patient Details  Name: Colton Owens MRN: [id number] Date of Birth: [birth year]  Date of referral:  03/15/15               Reason for consult:  Facility Placement                Permission sought to share information with:  Case Manager, Customer service manager, Family Supports Permission granted to share information::  Yes, Verbal Permission Granted  Name::     Reports he has a daughter and son-in law Olin Hauser)  Agency::  Agreeable for bed search/no facility currently  Relationship::     Contact Information:     Housing/Transportation Living arrangements for the past 2 months:  Single Family Home Source of Information:  Patient, Medical Team Patient Interpreter Needed:  None Criminal Activity/Legal Involvement Pertinent to Current Situation/Hospitalization:  No - Comment as needed Significant Relationships:  Adult Children, Other Family Members Lives with:  Self Do you feel safe going back to the place where you live?  Yes (however most likely will be needing rehab due to weakness and strength.) Need for family participation in patient care:  No (Coment) (patient alert and oriented, may need daughter to help motivate patient.)  Care giving concerns:  Patient reports living alone at home, has some family involved but reports he takes care of himself.  No barriers at this time.  Aware of SNF recommendation.   Social Worker assessment / plan:  LCSW met with patient and explained reason for consult and role. Patient accepting, however reporting he is in a lot of pain, been throwing up since Saturday and not feeling so well.  He reports he lives alone and wants to return to home if at possible, but open to the idea of SHORT term SNF.  Patient reports he has never been to a rehab facility and agreeable to referrals sent and options, but may decide to go home.  Family not present in room, but patient reports they will be coming soon.  Family involvement  will be helpful if patient resistant.    Employment status:  Retired Forensic scientist:  Medicare PT Recommendations:  [place] / Referral to community resources:  [place]  Patient/Family's Response to care:  Agreeable to option of SNF and referral complete.  However still asking to go home if physically and safely.  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 Reports it has been frustrating being sick and in bed, but compliant with plan and treatment at this time.  Patient appears aware of his prognosis and diagnosis.  Emotional Assessment Appearance:  Appears stated age Attitude/Demeanor/Rapport:  Other (Cooperative and Pleasant) Affect (typically observed):  Anxious, Hopeful Orientation:  Oriented to Self, Oriented to Place, Oriented to  Time, Oriented to Situation Alcohol / Substance use:  Not Applicable Psych involvement (Current and /or in the community):  No (Comment)  Discharge Needs  Concerns to be addressed:  Care Coordination, Adjustment to Illness Readmission within the last 30 days:  Yes Current discharge risk:  Dependent with Mobility Barriers to Discharge:  Continued Medical Work up   Lilly [place], LCSW 03/15/2015, 1:41 PM

## 2015-03-15 NOTE — Progress Notes (Signed)
PROGRESS NOTE  Colton Owens [id number] DOB: 03-11-1941 DOA: 03/10/2015 PCP: Lucilla EdinAUB, STEVE A, MD  Assessment/Plan: 1. Nausea vomiting with constipation -had large BM -dg abd as more nausea today and concern for ileus- may need NG Tube 2. Hyponatremia - chronic (around 129). 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 Hypertension - continue home medications. 4. Abdominal aortic aneurysm the CAT scan, chronic - further workup as outpatient. 5. Tobacco abuse - tobacco cessation counseling. Nicotine patch. 6. Leukocytosis probably reactionary. Follow CBC. Patient is afebrile. 7. Chronic resp failure- on 2L O2  Code Status: full Family Communication: patient Disposition Plan: SNF   Consultants:    Procedures:      HPI/Subjective: Nausea, no flatus and + vomiting  Objective: Filed Vitals:   03/15/15 0500  BP: 113/72  Pulse: 95  Temp: 98.4 F (36.9 C)  Resp: 22    Intake/Output Summary (Last 24 hours) at 03/15/15 1029 Last data filed at 03/15/15 0934  Gross per 24 hour  Intake 1490.83 ml  Output    653 ml  Net 837.83 ml   Filed Weights   03/10/15 2319 03/11/15 0559 03/11/15 1246  Weight: 61.236 kg (135 lb) 57.1 kg (125 lb 14.1 oz) 60.147 kg (132 lb 9.6 oz)    Exam:   General: pleasant  Cardiovascular: rrr  Respiratory: clear  Abdomen: +BS, soft  Musculoskeletal: no edema   Data Reviewed: Basic Metabolic Panel:  Recent Labs Lab 03/11/15 0847 03/12/15 0527 03/13/15 0600 03/14/15 0432 03/15/15 0832  NA 125* 121* 121* 124* 123*  K 4.5 4.2 3.8 4.3 4.3  CL 86* 86* 84* 89* 86*  CO2 31 29 31 30 30   GLUCOSE 85 81 79 94 95  BUN 8 <5* <5* 7 6  CREATININE 0.79 0.58* 0.64 0.44* 0.63  CALCIUM 8.4* 8.2* 7.8* 8.2* 8.0*   Liver Function Tests:  Recent Labs Lab 03/10/15 2324  AST 25  ALT 13*  ALKPHOS 75  BILITOT 1.0  PROT 6.8  ALBUMIN 3.5   No results for input(s): LIPASE, AMYLASE in the last 168 hours. No  results for input(s): AMMONIA in the last 168 hours. CBC:  Recent Labs Lab 03/10/15 2324 03/11/15 0847  WBC 13.1* 8.2  NEUTROABS 11.1* 6.5  HGB 15.6 13.6  HCT 46.6 40.8  MCV 95.7 96.2  PLT 384 318   Cardiac Enzymes: No results for input(s): CKTOTAL, CKMB, CKMBINDEX, TROPONINI in the last 168 hours. BNP (last 3 results) No results for input(s): BNP in the last 8760 hours.  ProBNP (last 3 results) No results for input(s): PROBNP in the last 8760 hours.  CBG: No results for input(s): GLUCAP in the last 168 hours.  No results found for this or any previous visit (from the past 240 hour(s)).   Studies: No results found.  Scheduled Meds: . antiseptic oral rinse  7 mL Mouth Rinse BID  . aspirin EC  81 mg Oral Daily  . docusate sodium  100 mg Oral BID  . enoxaparin (LOVENOX) injection  40 mg Subcutaneous Q24H  . feeding supplement (RESOURCE BREEZE)  1 Container Oral TID BM  . lactulose  30 g Oral BID  . metoprolol  50 mg Oral BID  . nicotine  14 mg Transdermal Daily  . pantoprazole  40 mg Oral Daily  . polyethylene glycol  17 g Oral Daily  . sodium chloride  3 mL Intravenous Q12H   Continuous Infusions: . sodium chloride 125 mL/hr at 03/15/15  1610   Antibiotics Given (last 72 hours)    None      Principal Problem:   Nausea & vomiting Active Problems:   Hyponatremia   COPD (chronic obstructive pulmonary disease)   CN (constipation)   Protein-calorie malnutrition, severe    Time spent: 25 min    Dondra Rhett  [facility] Pager [phone]. If 7PM-7AM, please contact night-coverage at [email or web address], password [facility] 03/15/2015, 10:29 AM  LOS: 4 days

## 2015-03-15 NOTE — Clinical Social Work Placement (Signed)
   CLINICAL SOCIAL WORK PLACEMENT  NOTE  Date:  03/15/2015  Patient Details  Name: Colton Owens MRN: [id number] Date of Birth: 28-Dec-1940  Clinical Social Work is seeking post-discharge placement for this patient at the Skilled  Nursing Facility level of care (*CSW will initial, date and re-position this form in  chart as items are completed):  Yes   Patient/family provided with [place] Clinical Social Work Department's list of facilities offering this level of care within the geographic area requested by the patient (or if unable, by the patient's family).  Yes   Patient/family informed of their freedom to choose among providers that offer the needed level of care, that participate in Medicare, Medicaid or managed care program needed by the patient, have an available bed and are willing to accept the patient.  Yes   Patient/family informed of [facility]'s ownership interest in [facility] and [facility], as well as of the fact that they are under no obligation to receive care at these facilities.  PASRR submitted to EDS on 03/15/15     PASRR number received on 03/15/15     Existing PASRR number confirmed on       FL2 transmitted to all facilities in geographic area requested by pt/family on       FL2 transmitted to all facilities within larger geographic area on 03/15/15     Patient informed that his/her managed care company has contracts with or will negotiate with certain facilities, including the following:            Patient/family informed of bed offers received.  Patient chooses bed at       Physician recommends and patient chooses bed at      Patient to be transferred to   on  .  Patient to be transferred to facility by       Patient family notified on   of transfer.  Name of family member notified:        PHYSICIAN Please sign FL2     Additional Comment:    _______________________________________________ Raye Sorrowoble, Zanaiya Calabria N, LCSW 03/15/2015, 2:00  PM

## 2015-03-16 LAB — BASIC METABOLIC PANEL
ANION GAP: 8 (ref 5–15)
BUN: 6 mg/dL (ref 6–20)
CALCIUM: 8.3 mg/dL — AB (ref 8.9–10.3)
CHLORIDE: 83 mmol/L — AB (ref 101–111)
CO2: 33 mmol/L — ABNORMAL HIGH (ref 22–32)
CREATININE: 0.72 mg/dL (ref 0.61–1.24)
GFR calc Af Amer: >60 mL/min (ref >60)
GFR calc non Af Amer: >60 mL/min (ref >60)
Glucose, Bld: 94 mg/dL (ref 65–99)
POTASSIUM: 4 mmol/L (ref 3.5–5.1)
Sodium: 124 mmol/L — ABNORMAL LOW (ref 135–145)

## 2015-03-16 NOTE — Progress Notes (Signed)
Physical Therapy Treatment Patient Details Name: Colton Owens MRN: [id number] DOB: 04-22-1941 Today's Date: 03/16/2015    History of Present Illness HPI: Colton Owens is a 74 y.o. male was recently admitted for hyponatremia 10 days ago presents to the ER because patient had one episode of nausea vomiting and difficulty moving bowels (has been 2 weeks)    PT Comments    Patient continues to require [place] assistance due to weakness and fatigue. Patient hesitant about SNF for ongoing therapies and would like to speak to CSW in regards to copayments. Patient wants to return home with home health services however educated patient that this would not be available 24/7 and at this time he is unable to fully care for himself safely at home. Patient in agreement to talk further with CSW and his options. Will update accordingly.   Follow Up Recommendations  SNF;Supervision/Assistance - 24 hour     Equipment Recommendations  Rolling walker with 5" wheels    Recommendations for Other Services       Precautions / Restrictions Precautions Precautions: Fall;Other (comment) (2L o2)    Mobility  Bed Mobility               General bed mobility comments: pt up in recliner  Transfers Overall transfer level: Needs assistance     Sit to Stand: Min guard         General transfer comment: Patient with safe technique  Ambulation/Gait Ambulation/Gait assistance: Min guard Ambulation Distance (Feet): 150 Feet Assistive device: Rolling walker (2 wheeled) Gait Pattern/deviations: Trunk flexed Gait velocity: decr   General Gait Details: Walked on 2L02 throughout. Attempted checking O2 but unable to get reading. No acute distress noted. Patient with safe use of RW. Limited by fatigue   Stairs            Wheelchair Mobility    Modified Rankin (Stroke Patients Only)       Balance                                    Cognition Arousal/Alertness:  Awake/alert Behavior During Therapy: WFL for tasks assessed/performed Overall Cognitive Status: Within Functional Limits for tasks assessed                      Exercises      General Comments        Pertinent Vitals/Pain Pain Score: 5  Pain Location: flank/back pain on R side Pain Intervention(s): Monitored during session;Repositioned;Limited activity within patient's tolerance    Home Living                      Prior Function            PT Goals (current goals can now be found in the care plan section) Progress towards PT goals: Progressing toward goals    Frequency  Min 3X/week    PT Plan Discharge plan needs to be updated    Co-evaluation             End of Session Equipment Utilized During Treatment: Gait belt;Oxygen Activity Tolerance: Patient limited by fatigue Patient left: in chair;with call bell/phone within reach;with chair alarm set     Time: [id number] PT Time Calculation (min) (ACUTE ONLY): 23 min  Charges:  $Gait Training: 8-22 mins $Therapeutic Activity: 8-22 mins  G Codes:      Fredrich Birks 03/16/2015, 8:51 AM 03/16/2015 Fredrich Birks PTA [phone] pager [phone] office

## 2015-03-16 NOTE — Progress Notes (Signed)
PROGRESS NOTE  Colton Owens [id number] DOB: 1941/02/08 DOA: 03/10/2015 PCP: Lucilla EdinAUB, STEVE A, MD  Assessment/Plan: 1. Nausea vomiting with constipation -had large BM initially then none -passing gas 2. 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 Hypertension - continue home medications. 4. Abdominal aortic aneurysm the CAT scan, chronic - further workup as outpatient. 5. Tobacco abuse - tobacco cessation counseling. Nicotine patch. 6. Leukocytosis probably reactionary. Follow CBC. Patient is afebrile. 7. Chronic resp failure- on 2L O2  Code Status: full Family Communication: patient Disposition Plan: SNF vs home (will ask social work to see)   Consultants:    Procedures:      HPI/Subjective: +flatus  Objective: Filed Vitals:   03/16/15 0625  BP: 144/77  Pulse:   Temp: 99.3 F (37.4 C)  Resp: 18    Intake/Output Summary (Last 24 hours) at 03/16/15 0921 Last data filed at 03/16/15 0727  Gross per 24 hour  Intake 1629.17 ml  Output   1575 ml  Net  54.17 ml   Filed Weights   03/10/15 2319 03/11/15 0559 03/11/15 1246  Weight: 61.236 kg (135 lb) 57.1 kg (125 lb 14.1 oz) 60.147 kg (132 lb 9.6 oz)    Exam:   General: pleasant  Cardiovascular: rrr  Respiratory: clear  Abdomen: +BS, soft  Musculoskeletal: no edema   Data Reviewed: Basic Metabolic Panel:  Recent Labs Lab 03/12/15 0527 03/13/15 0600 03/14/15 0432 03/15/15 0832 03/16/15 0013  NA 121* 121* 124* 123* 124*  K 4.2 3.8 4.3 4.3 4.0  CL 86* 84* 89* 86* 83*  CO2 29 31 30 30  33*  GLUCOSE 81 79 94 95 94  BUN <5* <5* 7 6 6   CREATININE 0.58* 0.64 0.44* 0.63 0.72  CALCIUM 8.2* 7.8* 8.2* 8.0* 8.3*   Liver Function Tests:  Recent Labs Lab 03/10/15 2324  AST 25  ALT 13*  ALKPHOS 75  BILITOT 1.0  PROT 6.8  ALBUMIN 3.5   No results for input(s): LIPASE, AMYLASE in the last 168 hours. No results for input(s): AMMONIA in the  last 168 hours. CBC:  Recent Labs Lab 03/10/15 2324 03/11/15 0847  WBC 13.1* 8.2  NEUTROABS 11.1* 6.5  HGB 15.6 13.6  HCT 46.6 40.8  MCV 95.7 96.2  PLT 384 318   Cardiac Enzymes: No results for input(s): CKTOTAL, CKMB, CKMBINDEX, TROPONINI in the last 168 hours. BNP (last 3 results) No results for input(s): BNP in the last 8760 hours.  ProBNP (last 3 results) No results for input(s): PROBNP in the last 8760 hours.  CBG:  Recent Labs Lab 03/15/15 1214 03/15/15 1605  GLUCAP 98 90    No results found for this or any previous visit (from the past 240 hour(s)).   Studies: Dg Abd Portable 1v  03/15/2015   CLINICAL DATA:  Ileus  EXAM: PORTABLE ABDOMEN - 1 VIEW  COMPARISON:  CT 03/11/2015  FINDINGS: Ejected decrease in gaseous prominence of multiple loops of bowel with a single prominent loop of colon projecting over the mid abdomen measuring 5.6 cm. No new abnormal radiopacity. Rightward curvature of the lumbar spine is noted centered at L2. Patchy bibasilar atelectasis or scarring.  IMPRESSION: Subjective decrease in caliber of gas-filled prominent loops of bowel, predominantly colon, likely indicating improving ileus.   Electronically Signed   By: Christiana PellantGretchen  Green M.D.   On: 03/15/2015 14:48    Scheduled Meds: . antiseptic oral rinse  7 mL Mouth Rinse BID  . aspirin  EC  81 mg Oral Daily  . docusate sodium  100 mg Oral BID  . enoxaparin (LOVENOX) injection  40 mg Subcutaneous Q24H  . feeding supplement (RESOURCE BREEZE)  1 Container Oral TID BM  . lactulose  30 g Oral BID  . metoCLOPramide (REGLAN) injection  5 mg Intravenous 3 times per day  . metoprolol  50 mg Oral BID  . nicotine  14 mg Transdermal Daily  . pantoprazole  40 mg Oral Daily  . polyethylene glycol  17 g Oral Daily  . sodium chloride  3 mL Intravenous Q12H   Continuous Infusions:   Antibiotics Given (last 72 hours)    None      Principal Problem:   Nausea & vomiting Active Problems:    Hyponatremia   COPD (chronic obstructive pulmonary disease)   CN (constipation)   Protein-calorie malnutrition, severe    Time spent: 25 min    VANN, JESSICA  [facility] Pager [phone]. If 7PM-7AM, please contact night-coverage at [email or web address], password [facility] 03/16/2015, 9:21 AM  LOS: 5 days

## 2015-03-16 NOTE — Progress Notes (Signed)
CSW met with patient today for lengthy conversation and provided SNF bed offers as he had agreed to SNF search.  Patient stated "I don't really need to go to the rehab- I"m doing so much better- I walked down the hall with the therapist."  Patient relates that he has a very supportive neighbor who checks on him and gets his groceries; his daughter also checks on him. He already receives [facility] for RN, PT and OT and would like to resume these services. His neighbor will give him a rolling walker that was her husband's prior to his passing away.  Patient does not feel that he really needs SNF- he has a portable phone that he keeps in his pocket and relates that he can call for help if needed. Patient did asked if he could "change his mind later" and seek SNF placement- CSW told him that he would need to notify his [facility] nurse to request a Education officer, museum to come and assist him. He would have a 30 day window of opportunity to seek SNF if he continued to meet criteria for placement. Patient is very concerned about his VA benefits that he feels have expired and wanted CSW to assist him with this. CSW discussed that he needed to contact his VA benefits office for directions on this. He verbalized understanding of this and stated that he would get his daughter to help him once he gets home.  Patient stated he is glad that he has decided to go home and denied any further questions or concerns. CSW will notify RNCM of above in the morning and plan d/c home when stable.  Notified Dr. Eliseo Squires of above information. CSW will sign off.  Lorie Phenix. Pauline Good, [place]

## 2015-03-17 MED ORDER — CETYLPYRIDINIUM CHLORIDE 0.05 % MT LIQD: 7.0000 mL | Freq: Two times a day (BID) | OROMUCOSAL | Status: AC

## 2015-03-17 MED ORDER — SIMETHICONE 80 MG PO CHEW: 80.0000 mg | CHEWABLE_TABLET | Freq: Four times a day (QID) | ORAL | Status: DC | PRN

## 2015-03-17 MED ORDER — NICOTINE 14 MG/24HR TD PT24: 14.0000 mg | MEDICATED_PATCH | Freq: Every day | TRANSDERMAL | Status: DC

## 2015-03-17 MED ORDER — BISACODYL 10 MG RE SUPP: 10.0000 mg | Freq: Every day | RECTAL | Status: DC | PRN

## 2015-03-17 MED ORDER — POLYETHYLENE GLYCOL 3350 17 G PO PACK
17.0000 g | PACK | Freq: Every day | ORAL | Status: DC
Start: 2015-03-17 — End: 2015-06-04

## 2015-03-17 MED ORDER — HYDROCODONE-ACETAMINOPHEN 5-325 MG PO TABS: 1.0000 | ORAL_TABLET | Freq: Four times a day (QID) | ORAL | Status: DC | PRN

## 2015-03-17 MED ORDER — BOOST / RESOURCE BREEZE PO LIQD: 1.0000 | Freq: Three times a day (TID) | ORAL | Status: DC

## 2015-03-17 MED ORDER — ALUM & MAG HYDROXIDE-SIMETH 200-200-20 MG/5ML PO SUSP: 15.0000 mL | Freq: Four times a day (QID) | ORAL | Status: DC | PRN

## 2015-03-17 MED ORDER — ONDANSETRON HCL 4 MG PO TABS: 4.0000 mg | ORAL_TABLET | Freq: Four times a day (QID) | ORAL | Status: DC | PRN

## 2015-03-17 MED ORDER — DOCUSATE SODIUM 100 MG PO CAPS: 100.0000 mg | ORAL_CAPSULE | Freq: Two times a day (BID) | ORAL | Status: DC

## 2015-03-17 MED ORDER — PANTOPRAZOLE SODIUM 40 MG PO TBEC: 40.0000 mg | DELAYED_RELEASE_TABLET | Freq: Every day | ORAL | Status: DC

## 2015-03-17 NOTE — Progress Notes (Signed)
Occupational Therapy Treatment Patient Details Name: Colton Owens MRN: [id number] DOB: [birth year] Today's Date: 03/17/2015    History of present illness HPI: Colton Owens is a 74 y.o. male was recently admitted for hyponatremia 10 days ago presents to the ER because patient had one episode of nausea vomiting and difficulty moving bowels (has been 2 weeks)   OT comments  Pt completing ADL tasks at overall set up level. [facility], pt states he "thought about it and wants to go [facility]" [facility]. Feel pt would benefit from SNF as pt needs to be mod I with ADL and IADL tasks, as pt lives alone. SW notified.  Follow Up Recommendations  SNF;Supervision/Assistance - 24 hour    Equipment Recommendations  3 in 1 bedside comode;Tub/shower bench    Recommendations for Other Services      Precautions / Restrictions Precautions Precautions: Fall;Other (comment) Precaution Comments: needs RW, likely O2 per clinical presentation       Mobility Bed Mobility Overal bed mobility: Modified Independent                Transfers Overall transfer level: Needs assistance   Transfers: Sit to/from Stand;Stand Pivot Transfers Sit to Stand: Min guard Stand pivot transfers: Min guard            Balance Overall balance assessment: Needs assistance         Standing balance support: During functional activity Standing balance-Leahy Scale: Fair                     ADL                                         General ADL Comments: Completing ADL @ overall set up level. Posterior sway during ADL. Pt states he feels unsteady. When asked about IADL tasks, he states that he would have to do these tasks unless he could get help from his friend. Pt stated that he had been thinking about it and that he prefers to go to a SNF to get stronger before going hom3e.                                      Cognition    Behavior During Therapy: WFL for tasks assessed/performed Overall Cognitive Status: No family/caregiver present to determine baseline cognitive functioning (most likely baseline)       Memory: Decreased short-term memory               Extremity/Trunk Assessment   generalized weakness             Pertinent Vitals/ Pain       Pain Assessment: 0-10 Pain Score: 4  Pain Location: R ribs Pain Descriptors / Indicators: Aching;Grimacing Pain Intervention(s): Limited activity within patient's tolerance;Monitored during session  Home Living                                          Prior Functioning/Environment              Frequency Min 2X/week     Progress Toward Goals  OT Goals(current goals can now be found in  the care plan section)  Progress towards OT goals: Progressing toward goals  Acute Rehab OT Goals Patient Stated Goal: feel better OT Goal Formulation: With patient Time For Goal Achievement: 03/26/15 Potential to Achieve Goals: Good ADL Goals Pt Will Perform Grooming: with supervision;standing Pt Will Perform Lower Body Bathing: with supervision;sit to/from stand Pt Will Perform Lower Body Dressing: with supervision;sit to/from stand Pt Will Transfer to Toilet: with supervision;ambulating Pt Will Perform Toileting - Clothing Manipulation and hygiene: with supervision;sit to/from stand  Plan Discharge plan remains appropriate    Co-evaluation                 End of Session Equipment Utilized During Treatment: Oxygen   Activity Tolerance Patient tolerated treatment well   Patient Left in bed;with call bell/phone within reach;with bed alarm set   Nurse Communication Mobility status;Other (comment) (patient's request to D/C to SNF)        Time: 9604-5409 OT Time Calculation (min): 26 min  Charges: OT General Charges $OT Visit: 1 Procedure OT Treatments $Self Care/Home Management : 23-37  mins  Hiram Mciver,[place] 03/17/2015, 5:31 PM   [facility], OTR/L  [phone] 03/17/2015

## 2015-03-17 NOTE — Progress Notes (Signed)
Patient has now changed his mind and is agreeable to short term SNF placement.  He is requesting [facility] which is one of the many facilities that offered on patient. Plan d/c to SNF tomorrow morning. Dr. Benjamine MolaVann is aware. CSW will contact Tonya, Ad[facility] in the morning to facilitate d/c.  Lorri Frederickonna T. Jaci LazierCrowder, [place]CS[place] [phone]

## 2015-03-17 NOTE — Discharge Summary (Addendum)
Physician Discharge Summary  Colton Owens WGN:[id number] DOB: 1941-06-09 DOA: 03/10/2015  PCP: Lucilla Edin, MD  Admit date: 03/10/2015 Discharge date: 03/18/2015  Time spent: 35 minutes  Recommendations for Outpatient Follow-up:  1. Fluid restrict to 1.5L/day 2. Bowel regimine for daily BMs 3. 2L O2 4. Outpatient GI follow up for EGD if continued abd pain  Discharge Diagnoses:  Principal Problem:   Nausea & vomiting Active Problems:   Hyponatremia   COPD (chronic obstructive pulmonary disease)   CN (constipation)   Protein-calorie malnutrition, severe   Discharge Condition: improved  Diet recommendation: soft with fluid restriction  Filed Weights   03/10/15 2319 03/11/15 0559 03/11/15 1246  Weight: 61.236 kg (135 lb) 57.1 kg (125 lb 14.1 oz) 60.147 kg (132 lb 9.6 oz)    History of present illness:  Colton Owens is a 74 y.o. male was recently admitted for hyponatremia 10 days ago presents to the ER because patient had one episode of nausea vomiting and difficulty moving bowels. Patient states it's almost 2 weeks since he moved his bowels. Patient had called his PCPs office who advised that if he develops nausea vomiting is gone to the ER. Patient developed nausea vomiting multiple episodes denies any blood in it and presented to the ER. In the ER CT abdomen and pelvis shows stool-filled colon. Prior to coming patient did have an enema given by patient's home health aide. Patient's sodium also was found to be low. Patient was given 1 L fluid bolus. Patient was admitted for further management. Prior to arrival to the floor patient had a large bowel movement. Patient still has nausea. Denies any chest pain or shortness of breath.   Hospital Course:  1. Nausea vomiting with constipation -resolved with BMs -passing gas -needs bowel regimine 2. 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 Hypertension - continue home  medications. 4. Abdominal aortic aneurysm the CAT scan, chronic - further workup as outpatient. 5. Tobacco abuse - tobacco cessation counseling. Nicotine patch. 6. Leukocytosis reactionary 7. Chronic resp failure- on 2L O2  Procedures:    Consultations:    Discharge Exam: Filed Vitals:   03/18/15 0701  BP: 137/82  Pulse: 81  Temp: 97.9 F (36.6 C)  Resp: 16    General: pleasant/cooperative- worried about paying his bills, eating regular diet Cardiovascular: rrr Respiratory: diminshed  Discharge Instructions   Discharge Instructions    Discharge instructions    Complete by:  As directed   Bowel regimen for daily BMs Fluid restrict to 1.5 L/day     Increase activity slowly    Complete by:  As directed           Current Discharge Medication List    START taking these medications   Details  alum & mag hydroxide-simeth (MAALOX/MYLANTA) 200-200-20 MG/5ML suspension Take 15 mLs by mouth every 6 (six) hours as needed for indigestion or heartburn. Qty: 355 mL, Refills: 0    antiseptic oral rinse (CPC / CETYLPYRIDINIUM CHLORIDE 0.05%) 0.05 % LIQD solution 7 mLs by Mouth Rinse route 2 (two) times daily. Refills: 0    bisacodyl (DULCOLAX) 10 MG suppository Place 1 suppository (10 mg total) rectally daily as needed for moderate constipation. Qty: 12 suppository, Refills: 0    docusate sodium (COLACE) 100 MG capsule Take 1 capsule (100 mg total) by mouth 2 (two) times daily. Qty: 10 capsule, Refills: 0    feeding supplement, RESOURCE BREEZE, (RESOURCE BREEZE) LIQD Take  1 Container by mouth 3 (three) times daily between meals. Refills: 0    HYDROcodone-acetaminophen (NORCO) 5-325 MG per tablet Take 1 tablet by mouth every 6 (six) hours as needed for moderate pain. Qty: 15 tablet, Refills: 0    nicotine (NICODERM CQ - DOSED IN MG/24 HOURS) 14 mg/24hr patch Place 1 patch (14 mg total) onto the skin daily. Qty: 28 patch, Refills: 0    ondansetron (ZOFRAN) 4 MG tablet  Take 1 tablet (4 mg total) by mouth every 6 (six) hours as needed for nausea. Qty: 20 tablet, Refills: 0    pantoprazole (PROTONIX) 40 MG tablet Take 1 tablet (40 mg total) by mouth daily.    polyethylene glycol (MIRALAX / GLYCOLAX) packet Take 17 g by mouth daily. Qty: 14 each, Refills: 0    simethicone (MYLICON) 80 MG chewable tablet Chew 1 tablet (80 mg total) by mouth every 6 (six) hours as needed for flatulence. Qty: 30 tablet, Refills: 0      CONTINUE these medications which have NOT CHANGED   Details  aspirin EC 81 MG tablet Take 81 mg by mouth daily.    metoprolol (LOPRESSOR) 50 MG tablet Take 50 mg by mouth 2 (two) times daily.  Refills: 1      STOP taking these medications     acetaminophen (TYLENOL) 325 MG tablet      cyclobenzaprine (FLEXERIL) 5 MG tablet        No Known Allergies Follow-up Information    Follow up with DAUB, STEVE A, MD In 1 week.   Specialty:  Family Medicine   Why:  with BMP/Appointment with Dr. Cleta Alberts is on 03/25/15 at 3:30pm   Contact information:   [address] [place] [address] [phone]        The results of significant diagnostics from this hospitalization (including imaging, microbiology, ancillary and laboratory) are listed below for reference.    Significant Diagnostic Studies: Dg Chest 1 View  03/11/2015   CLINICAL DATA:  Shortness of breath for 2 weeks  EXAM: CHEST  1 VIEW  COMPARISON:  Chest CT 02/28/2015  FINDINGS: Hyperinflation and emphysematous change with bronchial wall thickening. There is no edema, consolidation, effusion, or pneumothorax. Normal heart size and mediastinal contours. No acute osseous findings.  IMPRESSION: Emphysema without acute superimposed disease.   Electronically Signed   By: Marnee Spring M.D.   On: 03/11/2015 01:19   Ct Abdomen Pelvis W Contrast  03/11/2015   CLINICAL DATA:  Constipation and emesis  EXAM: CT ABDOMEN AND PELVIS WITH CONTRAST  TECHNIQUE: Multidetector CT imaging of the  abdomen and pelvis was performed using the standard protocol following bolus administration of intravenous contrast.  CONTRAST:  25mL OMNIPAQUE IOHEXOL 300 MG/ML SOLN, OMNIPAQUE IOHEXOL 300 MG/ML SOLN  COMPARISON:  02/28/2015  FINDINGS: BODY WALL: No contributory findings.  LOWER CHEST: Emphysema noted at the lung bases.  No acute findings.  ABDOMEN/PELVIS:  Liver: No focal abnormality.  Biliary: Prominent biliary tree without visible obstructive process. Noted normal bilirubin.  Pancreas: Unremarkable.  Spleen: Unremarkable.  Adrenals: Unremarkable.  Kidneys and ureters: No hydronephrosis or stone. Sub cm cyst in the lower right kidney.  Bladder: Unremarkable.  Reproductive: Mild central prostate enlargement, projecting into the bladder base.  Bowel: Formed stool distends each colonic segment, correlating with the history of constipation. No small bowel obstruction. No inflammatory bowel wall thickening. No pericecal inflammation.  Retroperitoneum: No mass or adenopathy.  Peritoneum: No ascites or pneumoperitoneum.  Vascular: Advanced atherosclerosis with unchanged  37 mm fusiform infrarenal aortic aneurysm that has thick mural thrombus. There is diffuse narrowing and atheromatous calcification of bilateral external iliac arteries. Stenoses were better evaluated on prior CTA.  OSSEOUS: No acute abnormalities.  IMPRESSION: 1. Extensive stool consistent with history of constipation. No bowel obstruction or inflammatory change. 2. Emphysema. 3. 37 mm infrarenal aortic aneurysm. Recommend followup by ultrasound in 2 years. This recommendation follows ACR consensus guidelines: White Paper of the ACR Incidental Findings Committee II on Vascular Findings. J Am Coll Radiol 2013; 10:789-794.   Electronically Signed   By: Marnee SpringJonathon  Watts M.D.   On: 03/11/2015 04:15   Dg Abd Portable 1v  03/15/2015   CLINICAL DATA:  Ileus  EXAM: PORTABLE ABDOMEN - 1 VIEW  COMPARISON:  CT 03/11/2015  FINDINGS: Ejected decrease in  gaseous prominence of multiple loops of bowel with a single prominent loop of colon projecting over the mid abdomen measuring 5.6 cm. No new abnormal radiopacity. Rightward curvature of the lumbar spine is noted centered at L2. Patchy bibasilar atelectasis or scarring.  IMPRESSION: Subjective decrease in caliber of gas-filled prominent loops of bowel, predominantly colon, likely indicating improving ileus.   Electronically Signed   By: Christiana PellantGretchen  Green M.D.   On: 03/15/2015 14:48   Ct Angio Chest Aorta W/cm &/or Wo/cm  02/28/2015   CLINICAL DATA:  Extremely short of breath and right-sided pain. Productive cough. Concern for aortic dissection  EXAM: CT ANGIOGRAPHY CHEST, ABDOMEN AND PELVIS  TECHNIQUE: Multidetector CT imaging through the chest, abdomen and pelvis was performed using the standard protocol during bolus administration of intravenous contrast. Multiplanar reconstructed images and MIPs were obtained and reviewed to evaluate the vascular anatomy.  CONTRAST:  100mL OMNIPAQUE IOHEXOL 350 MG/ML SOLN  COMPARISON:  None.  FINDINGS: CTA CHEST FINDINGS  Mediastinum/Nodes: Non IV contrast images demonstrates no intramural hematoma within the thoracic aorta. Contrast enhanced imaging demonstrates no aortic dissection or aneurysm. The great vessels are normal.  There are no filling defects within the pulmonary arteries to suggest acute pulmonary embolism. No pericardial fluid.  No mediastinal lymphadenopathy  Lungs/Pleura: Centrilobular emphysema in the upper lobes. There is a mild peribronchial thickening and endobronchial material accumulation likely representing mucus or secretions in the right lower lobe (image 91-24 series 6). No pulmonary edema. No pneumothorax.  Review of the MIP images confirms the above findings.  CTA ABDOMEN AND PELVIS FINDINGS  Vascular: There is no acute aneurysm or dissection of the abdominal aorta. There is mild fusiform dilatation of the infrarenal abdominal aorta to 3.7 cm. There  is calcified and noncalcified plaque within the lumen of the aorta. The celiac trunk and SMA are widely patent. There are bilateral patent renal arteries. No iliac artery aneurysm.  Hepatobiliary: No focal hepatic lesion. No biliary duct dilatation. Gallbladder is normal. Common bile duct is normal.  Pancreas: Pancreas is normal. No ductal dilatation. No pancreatic inflammation.  Spleen: Normal spleen  Adrenals/urinary tract: Adrenal glands and kidneys are normal. The ureters and bladder normal.  Stomach/Bowel: Stomach, small bowel, appendix, and cecum are normal. The colon and rectosigmoid colon are normal.  Vascular/Lymphatic: Abdominal aorta is normal caliber. There is no retroperitoneal or periportal lymphadenopathy. No pelvic lymphadenopathy.  Reproductive: Prostate normal.  Musculoskeletal: No aggressive osseous lesion.  Other: No free fluid.  Review of the MIP images confirms the above findings.  IMPRESSION: Chest Impression:  1. No evidence of aortic dissection or aneurysm. 2. No pulmonary embolism. 3. Mild peribronchial thickening and endoluminal material within the right lower  lobe may represent bronchitis. 4. Centrilobular emphysema in the upper lobes.  Abdomen / Pelvis Impression:  1. No Evidence of acute aneurysm or dissection of the abdominal aorta. 2. 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 Recommend followup by ultrasound in 2 years. This recommendation follows ACR consensus guidelines: White Paper of the ACR Incidental Findings Committee II on Vascular Findings. J Am Coll Radiol 2013; 10:789-794.   Electronically Signed   By: Genevive Bi M.D.   On: 02/28/2015 17:49   Ct Angio Abd/pel W/ And/or W/o  02/28/2015   CLINICAL DATA:  Extremely short of breath and right-sided pain. Productive cough. Concern for aortic dissection  EXAM: CT ANGIOGRAPHY CHEST, ABDOMEN AND PELVIS  TECHNIQUE: Multidetector CT imaging through the chest, abdomen and pelvis was performed using the  standard protocol during bolus administration of intravenous contrast. Multiplanar reconstructed images and MIPs were obtained and reviewed to evaluate the vascular anatomy.  CONTRAST:  OMNIPAQUE IOHEXOL 350 MG/ML SOLN  COMPARISON:  None.  FINDINGS: CTA CHEST FINDINGS  Mediastinum/Nodes: Non IV contrast images demonstrates no intramural hematoma within the thoracic aorta. Contrast enhanced imaging demonstrates no aortic dissection or aneurysm. The great vessels are normal.  There are no filling defects within the pulmonary arteries to suggest acute pulmonary embolism. No pericardial fluid.  No mediastinal lymphadenopathy  Lungs/Pleura: Centrilobular emphysema in the upper lobes. There is a mild peribronchial thickening and endobronchial material accumulation likely representing mucus or secretions in the right lower lobe (image 91-24 series 6). No pulmonary edema. No pneumothorax.  Review of the MIP images confirms the above findings.  CTA ABDOMEN AND PELVIS FINDINGS  Vascular: There is no acute aneurysm or dissection of the abdominal aorta. There is mild fusiform dilatation of the infrarenal abdominal aorta to 3.7 cm. There is calcified and noncalcified plaque within the lumen of the aorta. The celiac trunk and SMA are widely patent. There are bilateral patent renal arteries. No iliac artery aneurysm.  Hepatobiliary: No focal hepatic lesion. No biliary duct dilatation. Gallbladder is normal. Common bile duct is normal.  Pancreas: Pancreas is normal. No ductal dilatation. No pancreatic inflammation.  Spleen: Normal spleen  Adrenals/urinary tract: Adrenal glands and kidneys are normal. The ureters and bladder normal.  Stomach/Bowel: Stomach, small bowel, appendix, and cecum are normal. The colon and rectosigmoid colon are normal.  Vascular/Lymphatic: Abdominal aorta is normal caliber. There is no retroperitoneal or periportal lymphadenopathy. No pelvic lymphadenopathy.  Reproductive: Prostate normal.   Musculoskeletal: No aggressive osseous lesion.  Other: No free fluid.  Review of the MIP images confirms the above findings.  IMPRESSION: Chest Impression:  1. No evidence of aortic dissection or aneurysm. 2. No pulmonary embolism. 3. Mild peribronchial thickening and endoluminal material within the right lower lobe may represent bronchitis. 4. Centrilobular emphysema in the upper lobes.  Abdomen / Pelvis Impression:  1. No Evidence of acute aneurysm or dissection of the abdominal aorta. 2. 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 Recommend followup by ultrasound in 2 years. This recommendation follows ACR consensus guidelines: White Paper of the ACR Incidental Findings Committee II on Vascular Findings. J Am Coll Radiol 2013; 10:789-794.   Electronically Signed   By: Genevive Bi M.D.   On: 02/28/2015 17:49    Microbiology: No results found for this or any previous visit (from the past 240 hour(s)).   Labs: Basic Metabolic Panel:  Recent Labs Lab 03/12/15 0527 03/13/15 0600 03/14/15 1610 03/15/15 9604 03/16/15 0013 03/18/15 5409  NA 121* 121* 124* 123* 124*  --   K 4.2 3.8 4.3 4.3 4.0  --   CL 86* 84* 89* 86* 83*  --   CO2 33*  --   GLUCOSE 81 79 94 95 94  --   BUN <5* <5* --   CREATININE 0.58* 0.64 0.44* 0.63 0.72 0.69  CALCIUM 8.2* 7.8* 8.2* 8.0* 8.3*  --    Liver Function Tests: No results for input(s): AST, ALT, ALKPHOS, BILITOT, PROT, ALBUMIN in the last 168 hours. No results for input(s): LIPASE, AMYLASE in the last 168 hours. No results for input(s): AMMONIA in the last 168 hours. CBC: No results for input(s): WBC, NEUTROABS, HGB, HCT, MCV, PLT in the last 168 hours. Cardiac Enzymes: No results for input(s): CKTOTAL, CKMB, CKMBINDEX, TROPONINI in the last 168 hours. BNP: BNP (last 3 results) No results for input(s): BNP in the last 8760 hours.  ProBNP (last 3 results) No results for input(s): PROBNP in the last 8760  hours.  CBG:  Recent Labs Lab 03/15/15 1214 03/15/15 1605  GLUCAP 98 90       Signed:  Delno Blaisdell  [facility] 03/18/2015, 11:37 AM

## 2015-03-18 LAB — CREATININE, SERUM
Creatinine, Ser: 0.69 mg/dL (ref 0.61–1.24)
GFR calc non Af Amer: >60 mL/min (ref >60)

## 2015-03-18 NOTE — Progress Notes (Signed)
Patient was discharged to nursing home [facility]) by MD order; discharged instructions  review and sent to facility with care notes and prescriptions; IV DIC; skin intact; facility was called for report but the secretary said the nurse who is going to receive the patient will call me later; patient will be transported to facility via EMS.

## 2015-03-18 NOTE — Clinical Social Work Placement (Signed)
   CLINICAL SOCIAL WORK PLACEMENT  NOTE  Date:  03/18/2015  Patient Details  Name: Colton Owens MRN: [id number] Date of Birth: [birth year]  Clinical Social Work is seeking post-discharge placement for this patient at the Skilled  Nursing Facility level of care (*CSW will initial, date and re-position this form in  chart as items are completed):  Yes   Patient/family provided with [facility] Social Work Department's list of facilities offering this level of care within the geographic area requested by the patient (or if unable, by the patient's family).  Yes   Patient/family informed of their freedom to choose among providers that offer the needed level of care, that participate in Medicare, Medicaid or managed care program needed by the patient, have an available bed and are willing to accept the patient.  Yes   Patient/family informed of [facility]'s ownership interest in [facility] and [facility], as well as of the fact that they are under no obligation to receive care at these facilities.  PASRR submitted to EDS on 03/15/15     PASRR number received on 03/15/15     Existing PASRR number confirmed on       FL2 transmitted to all facilities in geographic area requested by pt/family on       FL2 transmitted to all facilities within larger geographic area on 03/15/15     Patient informed that his/her managed care company has contracts with or will negotiate with certain facilities, including the following:   (NA- has Medicare)     Yes   Patient/family informed of bed offers received.  Patient chooses bed at [facility]     Physician recommends and patient chooses bed at      [facility] to be [name] on 03/18/15.  Patient to be transferred to facility by Amb[name]     Patient family notified on 03/18/15 of transfer.  Name of family member notified:  Colton Owens- Daughter     PHYSICIAN Please sign FL2,  Please prepare priority discharge summary, including medications, Please prepare prescriptions     Additional Comment: Ok per MD for d/c today to SNF. Patient is agreeable and pleased with d/c plan. Nursing notified to call report. [facility] and message left for daughter [name] re: dc. No further CSW needs identified. CSW signing off. Lorri Frederickonna T. Andria RheinCrowder, LCSW [phone]      _______________________________________________ Darylene Pricerowder, Tovah Slavick T, LCSW 03/18/2015, 12:16 PM

## 2015-03-19 ENCOUNTER — Encounter: Payer: Self-pay | Admitting: Internal Medicine

## 2015-03-19 ENCOUNTER — Non-Acute Institutional Stay (SKILLED_NURSING_FACILITY): Payer: Medicare Other | Admitting: Internal Medicine

## 2015-03-19 DIAGNOSIS — I714 Abdominal aortic aneurysm, without rupture, unspecified: Secondary | ICD-10-CM

## 2015-03-19 DIAGNOSIS — E43 Unspecified severe protein-calorie malnutrition: Secondary | ICD-10-CM | POA: Diagnosis not present

## 2015-03-19 DIAGNOSIS — E871 Hypo-osmolality and hyponatremia: Secondary | ICD-10-CM

## 2015-03-19 DIAGNOSIS — J9611 Chronic respiratory failure with hypoxia: Secondary | ICD-10-CM

## 2015-03-19 DIAGNOSIS — K5900 Constipation, unspecified: Secondary | ICD-10-CM | POA: Diagnosis not present

## 2015-03-19 DIAGNOSIS — I10 Essential (primary) hypertension: Secondary | ICD-10-CM

## 2015-03-19 NOTE — Assessment & Plan Note (Signed)
Cont metoprolol 50 mg BID

## 2015-03-19 NOTE — Assessment & Plan Note (Signed)
Chronic (around 129). 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

## 2015-03-19 NOTE — Progress Notes (Signed)
MRN: [id number] Name: Colton Owens  Sex: male Age: 74 y.o. DOB: 1941-05-30  PSC #: [facility] Facility/Room:307 Level Of Care: SNF Provider: Merrilee Seashore D Emergency Contacts: Extended Emergency Contact Information Primary Emergency Contact: Marge [place], [place] [address] [place] Home Phone: [phone] Relation: Daughter Secondary Emergency Contact: Kem Parkinson [place] Home Phone: [phone] Relation: Grandson  Code Status:   Allergies: Review of patient's allergies indicates no known allergies.  Chief Complaint  Patient presents with  . New Admit To SNF    HPI: Patient is 74 y.o. male who is admited to SNF for generalized weakness after admission for constipation and hyponatremia.  Past Medical History  Diagnosis Date  . Hypertension   . High cholesterol   . Hyponatremia syndrome   . Chronic cor pulmonale   . COPD (chronic obstructive pulmonary disease)   . Aortic aneurysm   . Chronic respiratory failure with hypoxia 03/01/2015    Past Surgical History  Procedure Laterality Date  . Angioplasty    . Left heart catheterization with coronary angiogram N/A 10/23/2012    Procedure: LEFT HEART CATHETERIZATION WITH CORONARY ANGIOGRAM;  Surgeon: [name], MD;  Location: [facility] CATH LAB;  Service: Cardiovascular;  Laterality: N/A;  . Abdominal angiogram N/A 10/23/2012    Procedure: ABDOMINAL ANGIOGRAM;  Surgeon: [name], MD;  Location: [facility] CATH LAB;  Service: Cardiovascular;  Laterality: N/A;      Medication List       This list is accurate as of: 03/19/15  9:24 PM.  Always use your most recent med list.               alum & mag hydroxide-simeth 200-200-20 MG/5ML suspension  Commonly known as:  MAALOX/MYLANTA  Take 15 mLs by mouth every 6 (six) hours as needed for indigestion or heartburn.     antiseptic oral rinse 0.05 % Liqd solution  Commonly known as:  CPC / CETYLPYRIDINIUM CHLORIDE 0.05%  7  mLs by Mouth Rinse route 2 (two) times daily.     aspirin EC 81 MG tablet  Take 81 mg by mouth daily.     bisacodyl 10 MG suppository  Commonly known as:  DULCOLAX  Place 1 suppository (10 mg total) rectally daily as needed for moderate constipation.     docusate sodium 100 MG capsule  Commonly known as:  COLACE  Take 1 capsule (100 mg total) by mouth 2 (two) times daily.     feeding supplement (RESOURCE BREEZE) Liqd  Take 1 Container by mouth 3 (three) times daily between meals.     HYDROcodone-acetaminophen 5-325 MG per tablet  Commonly known as:  NORCO  Take 1 tablet by mouth every 6 (six) hours as needed for moderate pain.     metoprolol 50 MG tablet  Commonly known as:  LOPRESSOR  Take 50 mg by mouth 2 (two) times daily.     nicotine 14 mg/24hr patch  Commonly known as:  NICODERM CQ - dosed in mg/24 hours  Place 1 patch (14 mg total) onto the skin daily.     ondansetron 4 MG tablet  Commonly known as:  ZOFRAN  Take 1 tablet (4 mg total) by mouth every 6 (six) hours as needed for nausea.     pantoprazole 40 MG tablet  Commonly known as:  PROTONIX  Take 1 tablet (40 mg total) by mouth daily.     polyethylene glycol packet  Commonly  known as:  MIRALAX / GLYCOLAX  Take 17 g by mouth daily.     simethicone 80 MG chewable tablet  Commonly known as:  MYLICON  Chew 1 tablet (80 mg total) by mouth every 6 (six) hours as needed for flatulence.        No orders of the defined types were placed in this encounter.    There is no immunization history for the selected administration types on file for this patient.  History  Substance Use Topics  . 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 Smokeless tobacco: Never Used  . Alcohol Use: No    Family history is noncontributory    Review of Systems  DATA OBTAINED: from patient, nurse GENERAL:  no fevers, fatigue, appetite changes SKIN: No itching, rash or  wounds EYES: No eye pain, redness, discharge EARS: No earache, tinnitus, change in hearing NOSE: No congestion, drainage or bleeding  MOUTH/THROAT: No mouth or tooth pain, No sore throat RESPIRATORY: No cough, wheezing, SOB CARDIAC: No chest pain, palpitations, lower extremity edema  GI: No abdominal pain, No N/V/D ; has not had BM today; No heartburn or reflux  GU: No dysuria, frequency or urgency, or incontinence  MUSCULOSKELETAL: No unrelieved bone/joint pain NEUROLOGIC: No headache, dizziness or focal weakness PSYCHIATRIC: No overt anxiety or sadness, No behavior issue.   Filed Vitals:   03/19/15 2111  BP: 136/87  Pulse: 87  Temp: 97.9 F (36.6 C)  Resp: 20    Physical Exam  GENERAL APPEARANCE: Alert, conversant,  Thin WM, No acute distress.  SKIN: No diaphoresis rash HEAD: Normocephalic, atraumatic  EYES: Conjunctiva/lids clear. Pupils round, reactive. EOMs intact.  EARS: External exam WNL, canals clear. Hearing grossly normal.  NOSE: No deformity or discharge.  MOUTH/THROAT: Lips w/o lesions  RESPIRATORY: Breathing is even, unlabored. Lung sounds are clear but decreased, wearing O2  CARDIOVASCULAR: Heart RRR no murmurs, rubs or gallops. No peripheral edema.   GASTROINTESTINAL: Abdomen is soft, non-tender, not distended w/ normal bowel sounds. GENITOURINARY: Bladder non tender, not distended  MUSCULOSKELETAL: No abnormal joints or musculature NEUROLOGIC:  Cranial nerves 2-12 grossly intact. Moves all extremities  PSYCHIATRIC: Mood and affect appropriate to situation, no behavioral issues  Patient Active Problem List   Diagnosis Date Noted  . Protein-calorie malnutrition, severe 03/12/2015  . Nausea & vomiting 03/11/2015  . CN (constipation) 03/11/2015  . Chronic respiratory failure with hypoxia 03/01/2015  . COPD (chronic obstructive pulmonary disease) 03/01/2015  . Debility 03/01/2015  . Bilateral flank pain   . Hyponatremia 02/28/2015  . Bilateral lower  extremity edema 03/01/2013  . Essential hypertension, benign 09/09/2012  . Other and unspecified hyperlipidemia 09/09/2012  . Syncope and collapse 09/09/2012  . Abdominal aortic aneurysm 08/29/2012    CBC    Component Value Date/Time   WBC 8.2 03/11/2015 0847   WBC 8.5 08/20/2012 1308   RBC 4.24 03/11/2015 0847   RBC 4.80 08/20/2012 1308   HGB 13.6 03/11/2015 0847   HGB 14.7 08/20/2012 1308   HCT 40.8 03/11/2015 0847   HCT 48.5 08/20/2012 1308   PLT 318 03/11/2015 0847   MCV 96.2 03/11/2015 0847   MCV 101.0* 08/20/2012 1308   LYMPHSABS 0.8 03/11/2015 0847   MONOABS 0.9 03/11/2015 0847   EOSABS 0.0 03/11/2015 0847   BASOSABS 0.0 03/11/2015 0847    CMP     Component Value Date/Time   NA 124* 03/16/2015 0013   K  4.0 03/16/2015 0013   CL 83* 03/16/2015 0013   CO2 33* 03/16/2015 0013   GLUCOSE 94 03/16/2015 0013   BUN 6 03/16/2015 0013   CREATININE 0.69 03/18/2015 0733   CALCIUM 8.3* 03/16/2015 0013   PROT 6.8 03/10/2015 2324   ALBUMIN 3.5 03/10/2015 2324   AST 25 03/10/2015 2324   ALT 13* 03/10/2015 2324   ALKPHOS 75 03/10/2015 2324   BILITOT 1.0 03/10/2015 2324   GFRNONAA >60 03/18/2015 0733   GFRAA >60 03/18/2015 0733    Assessment and Plan  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

## 2015-03-19 NOTE — Assessment & Plan Note (Signed)
Not new;F/u as outpt

## 2015-03-19 NOTE — Assessment & Plan Note (Signed)
Improve diet with food and supplements

## 2015-03-19 NOTE — Assessment & Plan Note (Signed)
Chronic O2 2L

## 2015-03-19 NOTE — Assessment & Plan Note (Signed)
Sx resolved with BMs; d/c on colace BID and miralax daily-will inc to BID also

## 2015-03-25 ENCOUNTER — Ambulatory Visit: Payer: Medicare Other | Admitting: Physician Assistant

## 2015-03-26 ENCOUNTER — Encounter: Payer: Self-pay | Admitting: Internal Medicine

## 2015-03-26 ENCOUNTER — Non-Acute Institutional Stay (SKILLED_NURSING_FACILITY): Payer: Medicare Other | Admitting: Internal Medicine

## 2015-03-26 DIAGNOSIS — F23 Brief psychotic disorder: Secondary | ICD-10-CM | POA: Diagnosis not present

## 2015-03-28 ENCOUNTER — Encounter: Payer: Self-pay | Admitting: Internal Medicine

## 2015-03-28 DIAGNOSIS — F29 Unspecified psychosis not due to a substance or known physiological condition: Secondary | ICD-10-CM

## 2015-03-28 HISTORY — DX: Unspecified psychosis not due to a substance or known physiological condition: F29

## 2015-03-28 NOTE — Progress Notes (Signed)
MRN: [id number] Name: Colton Owens  Sex: male Age: 74 y.o. DOB: [birth year]  [facility] Facility/Room:307 Level Of Care: SNF Provider: Merrilee Seashore D Emergency Contacts: Extended Emergency Contact Information Primary Emergency Contact: Marge [place], [place] [address] [place] Home Phone: [phone] Relation: Daughter Secondary Emergency Contact: Kem Parkinson [place] Home Phone: [phone] Relation: Grandson  Code Status:   Allergies: Review of patient's allergies indicates no known allergies.  Chief Complaint  Patient presents with  . Acute Visit    HPI: Patient is 74 y.o. male who nursing has asked me to see because he has been seeing demons.  Past Medical History  Diagnosis Date  . Hypertension   . High cholesterol   . Hyponatremia syndrome   . Chronic cor pulmonale   . COPD (chronic obstructive pulmonary disease)   . Aortic aneurysm   . Chronic respiratory failure with hypoxia 03/01/2015    Past Surgical History  Procedure Laterality Date  . Angioplasty    . Left heart catheterization with coronary angiogram N/A 10/23/2012    Procedure: LEFT HEART CATHETERIZATION WITH CORONARY ANGIOGRAM;  Surgeon: [name], MD;  Location: [facility] CATH LAB;  Service: Cardiovascular;  Laterality: N/A;  . Abdominal angiogram N/A 10/23/2012    Procedure: ABDOMINAL ANGIOGRAM;  Surgeon: [name], MD;  Location: [facility] CATH LAB;  Service: Cardiovascular;  Laterality: N/A;      Medication List       This list is accurate as of: 03/26/15 11:59 PM.  Always use your most recent med list.               alum & mag hydroxide-simeth 200-200-20 MG/5ML suspension  Commonly known as:  MAALOX/MYLANTA  Take 15 mLs by mouth every 6 (six) hours as needed for indigestion or heartburn.     antiseptic oral rinse 0.05 % Liqd solution  Commonly known as:  CPC / CETYLPYRIDINIUM CHLORIDE 0.05%  7 mLs by Mouth Rinse route 2 (two)  times daily.     aspirin EC 81 MG tablet  Take 81 mg by mouth daily.     bisacodyl 10 MG suppository  Commonly known as:  DULCOLAX  Place 1 suppository (10 mg total) rectally daily as needed for moderate constipation.     docusate sodium 100 MG capsule  Commonly known as:  COLACE  Take 1 capsule (100 mg total) by mouth 2 (two) times daily.     feeding supplement (RESOURCE BREEZE) Liqd  Take 1 Container by mouth 3 (three) times daily between meals.     HYDROcodone-acetaminophen 5-325 MG per tablet  Commonly known as:  NORCO  Take 1 tablet by mouth every 6 (six) hours as needed for moderate pain.     metoprolol 50 MG tablet  Commonly known as:  LOPRESSOR  Take 50 mg by mouth 2 (two) times daily.     nicotine 14 mg/24hr patch  Commonly known as:  NICODERM CQ - dosed in mg/24 hours  Place 1 patch (14 mg total) onto the skin daily.     ondansetron 4 MG tablet  Commonly known as:  ZOFRAN  Take 1 tablet (4 mg total) by mouth every 6 (six) hours as needed for nausea.     pantoprazole 40 MG tablet  Commonly known as:  PROTONIX  Take 1 tablet (40 mg total) by mouth daily.     polyethylene glycol packet  Commonly known as:  MIRALAX /  GLYCOLAX  Take 17 g by mouth daily.     simethicone 80 MG chewable tablet  Commonly known as:  MYLICON  Chew 1 tablet (80 mg total) by mouth every 6 (six) hours as needed for flatulence.        No orders of the defined types were placed in this encounter.    There is no immunization history for the selected administration types on file for this patient.  History  Substance Use Topics  . 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 Smokeless tobacco: Never Used  . Alcohol Use: No    Review of Systems  DATA OBTAINED: from patient, nurse GENERAL:  no fevers, fatigue, appetite changes SKIN: No itching, rash HEENT: No complaint RESPIRATORY: No cough, wheezing, SOB CARDIAC: No chest  pain, palpitations, lower extremity edema  GI: No abdominal pain, No N/V/D or constipation, No heartburn or reflux  GU: No dysuria, frequency or urgency, or incontinence  MUSCULOSKELETAL: No unrelieved bone/joint pain NEUROLOGIC: No headache, dizziness  PSYCHIATRIC: occurs just at night; pt says that he is afraid and was up most of night;demons do not tell him to harm himself or others  Filed Vitals:   03/26/15 1551  BP: 158/72  Pulse: 76  Temp: 97.5 F (36.4 C)  Resp: 18    Physical Exam  GENERAL APPEARANCE: Alert, conversant, No acute distress  SKIN: No diaphoresis rash, or wounds HEENT: Unremarkable RESPIRATORY: Breathing is even, unlabored. Lung sounds are clear   CARDIOVASCULAR: Heart RRR no murmurs, rubs or gallops. No peripheral edema  GASTROINTESTINAL: Abdomen is soft, non-tender, not distended w/ normal bowel sounds.  GENITOURINARY: Bladder non tender, not distended  MUSCULOSKELETAL: No abnormal joints or musculature NEUROLOGIC: Cranial nerves 2-12 grossly intact. Moves all extremities PSYCHIATRIC: Mood and affect appropriate to situation, no behavioral issues  Patient Active Problem List   Diagnosis Date Noted  . Psychosis 03/28/2015  . Protein-calorie malnutrition, severe 03/12/2015  . Nausea & vomiting 03/11/2015  . CN (constipation) 03/11/2015  . Chronic respiratory failure with hypoxia 03/01/2015  . COPD (chronic obstructive pulmonary disease) 03/01/2015  . Debility 03/01/2015  . Bilateral flank pain   . Hyponatremia 02/28/2015  . Bilateral lower extremity edema 03/01/2013  . Essential hypertension, benign 09/09/2012  . Other and unspecified hyperlipidemia 09/09/2012  . Syncope and collapse 09/09/2012  . Abdominal aortic aneurysm 08/29/2012    CBC    Component Value Date/Time   WBC 8.2 03/11/2015 0847   WBC 8.5 08/20/2012 1308   RBC 4.24 03/11/2015 0847   RBC 4.80 08/20/2012 1308   HGB 13.6 03/11/2015 0847   HGB 14.7 08/20/2012 1308   HCT 40.8  03/11/2015 0847   HCT 48.5 08/20/2012 1308   PLT 318 03/11/2015 0847   MCV 96.2 03/11/2015 0847   MCV 101.0* 08/20/2012 1308   LYMPHSABS 0.8 03/11/2015 0847   MONOABS 0.9 03/11/2015 0847   EOSABS 0.0 03/11/2015 0847   BASOSABS 0.0 03/11/2015 0847    CMP     Component Value Date/Time   NA 124* 03/16/2015 0013   K 4.0 03/16/2015 0013   CL 83* 03/16/2015 0013   CO2 33* 03/16/2015 0013   GLUCOSE 94 03/16/2015 0013   BUN 6 03/16/2015 0013   CREATININE 0.69 03/18/2015 0733   CALCIUM 8.3* 03/16/2015 0013   PROT 6.8 03/10/2015 2324   ALBUMIN 3.5 03/10/2015 2324   AST 25 03/10/2015 2324   ALT 13* 03/10/2015 2324  ALKPHOS 75 03/10/2015 2324   BILITOT 1.0 03/10/2015 2324   GFRNONAA >60 03/18/2015 0733   GFRAA >60 03/18/2015 0733    Assessment and Plan  No problem-specific assessment & plan notes found for this encounter.  Pt seen 03/26/2015 Margit Hanks, MD

## 2015-03-28 NOTE — Assessment & Plan Note (Addendum)
Nothing in hx to suggest metablic or infectious problem. I have consulted psych to see pt but won't be here until next week.I hate to leave pt afraid, I don't think an anxiolytic will sove the problem. Have written for seroquel 25 mg qHS and will follow.

## 2015-04-02 ENCOUNTER — Telehealth: Payer: Self-pay | Admitting: *Deleted

## 2015-04-02 NOTE — Telephone Encounter (Signed)
Faxed signed orders dated 03/11/2015  to [facility] [employer]. These orders were found in the abstract pile signed by Dr Milus Glazier. Confirmation page received at 4:19 pm.

## 2015-04-03 ENCOUNTER — Non-Acute Institutional Stay (SKILLED_NURSING_FACILITY): Payer: Medicare Other | Admitting: Nurse Practitioner

## 2015-04-03 DIAGNOSIS — R5381 Other malaise: Secondary | ICD-10-CM | POA: Diagnosis not present

## 2015-04-03 DIAGNOSIS — I714 Abdominal aortic aneurysm, without rupture, unspecified: Secondary | ICD-10-CM

## 2015-04-03 DIAGNOSIS — K5900 Constipation, unspecified: Secondary | ICD-10-CM

## 2015-04-03 DIAGNOSIS — F29 Unspecified psychosis not due to a substance or known physiological condition: Secondary | ICD-10-CM | POA: Diagnosis not present

## 2015-04-03 DIAGNOSIS — E871 Hypo-osmolality and hyponatremia: Secondary | ICD-10-CM | POA: Diagnosis not present

## 2015-04-03 DIAGNOSIS — J449 Chronic obstructive pulmonary disease, unspecified: Secondary | ICD-10-CM

## 2015-04-03 MED ORDER — LINACLOTIDE 145 MCG PO CAPS: 145.0000 ug | ORAL_CAPSULE | Freq: Every day | ORAL | Status: DC

## 2015-04-03 NOTE — Progress Notes (Signed)
Patient ID: Colton Owens, male   DOB: [birth year], 74 y.o.   MRN: [id number]    Nursing Home Location:  [place] of Service: SNF (31)  PCP: Jenny Reichmann, MD  No Known Allergies  Chief Complaint  Patient presents with  . Discharge Note    HPI:  Patient is a 74 y.o. male seen today at [facility] for discharge home with son. Pt with a pmh of COPD, constipation, hyponatremia, back pain who was hospitalized from 5/24-6/1 due to hyponatremia and constipation. Pt was placed on fluid restriction and given bowel regimen. Pt at [facility] for strength and gait training. Patient currently doing well with therapy, now stable to discharge home with home health.  Review of Systems:  Review of Systems  Constitutional: Positive for appetite change (decreased appetite). Negative for activity change, fatigue and unexpected weight change.  HENT: Negative for congestion and hearing loss.   Eyes: Negative.   Respiratory: Negative for cough and shortness of breath.   Cardiovascular: Negative for chest pain, palpitations and leg swelling.  Gastrointestinal: Positive for constipation. Negative for nausea, vomiting, abdominal pain, diarrhea and abdominal distention.  Genitourinary: Negative for dysuria and difficulty urinating.  Musculoskeletal: Positive for back pain (norco helps ). Negative for myalgias and arthralgias.  Skin: Negative for color change and wound.  Neurological: Negative for dizziness and weakness.  Psychiatric/Behavioral: Positive for hallucinations (previously having halluninations, no recent reports). Negative for behavioral problems, confusion and agitation.    Past Medical History  Diagnosis Date  . Hypertension   . High cholesterol   . Hyponatremia syndrome   . Chronic cor pulmonale   . COPD (chronic obstructive pulmonary disease)   . Aortic aneurysm   . Chronic respiratory failure with hypoxia 03/01/2015   Past Surgical History    Procedure Laterality Date  . Angioplasty    . Left heart catheterization with coronary angiogram N/A 10/23/2012    Procedure: LEFT HEART CATHETERIZATION WITH CORONARY ANGIOGRAM;  Surgeon: Laverda Page, MD;  Location: [facility] CATH LAB;  Service: Cardiovascular;  Laterality: N/A;  . Abdominal angiogram N/A 10/23/2012    Procedure: ABDOMINAL ANGIOGRAM;  Surgeon: Laverda Page, MD;  Location: [facility] CATH LAB;  Service: Cardiovascular;  Laterality: N/A;   Social History:   reports that he has been smoking Cigarettes.  He has a 58 pack-year smoking history. He has never used smokeless tobacco. He reports that he does not drink alcohol or use illicit drugs.  No family history on file.  Medications: Patient's Medications  New Prescriptions   No medications on file  Previous Medications   ALUM & MAG HYDROXIDE-SIMETH (MAALOX/MYLANTA) 200-200-20 MG/5ML SUSPENSION    Take 15 mLs by mouth every 6 (six) hours as needed for indigestion or heartburn.   ANTISEPTIC ORAL RINSE (CPC / CETYLPYRIDINIUM CHLORIDE 0.05%) 0.05 % LIQD SOLUTION    7 mLs by Mouth Rinse route 2 (two) times daily.   ASPIRIN EC 81 MG TABLET    Take 81 mg by mouth daily.   BISACODYL (DULCOLAX) 10 MG SUPPOSITORY    Place 1 suppository (10 mg total) rectally daily as needed for moderate constipation.   DOCUSATE SODIUM (COLACE) 100 MG CAPSULE    Take 1 capsule (100 mg total) by mouth 2 (two) times daily.   FEEDING SUPPLEMENT, RESOURCE BREEZE, (RESOURCE BREEZE) LIQD    Take 1 Container by mouth 3 (three) times daily between meals.   HYDROCODONE-ACETAMINOPHEN (NORCO) 5-325 MG PER TABLET  Take 1 tablet by mouth every 6 (six) hours as needed for moderate pain.   METOPROLOL (LOPRESSOR) 50 MG TABLET    Take 50 mg by mouth 2 (two) times daily.    NICOTINE (NICODERM CQ - DOSED IN MG/24 HOURS) 14 MG/24HR PATCH    Place 1 patch (14 mg total) onto the skin daily.   ONDANSETRON (ZOFRAN) 4 MG TABLET    Take 1 tablet (4 mg total) by mouth every 6 (six)  hours as needed for nausea.   PANTOPRAZOLE (PROTONIX) 40 MG TABLET    Take 1 tablet (40 mg total) by mouth daily.   POLYETHYLENE GLYCOL (MIRALAX / GLYCOLAX) PACKET    Take 17 g by mouth daily.   SIMETHICONE (MYLICON) 80 MG CHEWABLE TABLET    Chew 1 tablet (80 mg total) by mouth every 6 (six) hours as needed for flatulence.  Modified Medications   No medications on file  Discontinued Medications   No medications on file     Physical Exam: Filed Vitals:   04/03/15 1233  BP: 120/70  Pulse: 62  Temp: 97.1 F (36.2 C)  Resp: 20  SpO2: 95%    Physical Exam  Constitutional: He is oriented to person, place, and time. No distress.  Thin frail male in NAD  HENT:  Head: Normocephalic and atraumatic.  Mouth/Throat: Oropharynx is clear and moist. No oropharyngeal exudate.  Eyes: Conjunctivae and EOM are normal. Pupils are equal, round, and reactive to light.  Neck: Normal range of motion. Neck supple.  Cardiovascular: Normal rate, regular rhythm and normal heart sounds.   Pulmonary/Chest: Effort normal and breath sounds normal.  Abdominal: Soft. Bowel sounds are normal. He exhibits no distension. There is no tenderness.  Musculoskeletal: He exhibits no edema or tenderness.  Neurological: He is alert and oriented to person, place, and time.  Skin: Skin is warm and dry. He is not diaphoretic.  Psychiatric: He has a normal mood and affect.    Labs reviewed: Basic Metabolic Panel:  Recent Labs  03/14/15 0432 03/15/15 0832 03/16/15 0013 03/18/15 0733  NA 124* 123* 124*  --   K 4.3 4.3 4.0  --   CL 89* 86* 83*  --   CO2 30 30 33*  --   GLUCOSE 94 95 94  --   BUN 7 6 6   --   CREATININE 0.44* 0.63 0.72 0.69  CALCIUM 8.2* 8.0* 8.3*  --    Liver Function Tests:  Recent Labs  02/28/15 1433 03/01/15 0619 03/10/15 2324  AST 24 21 25   ALT 13* 11* 13*  ALKPHOS 61 54 75  BILITOT 0.8 0.6 1.0  PROT 6.1* 5.7* 6.8  ALBUMIN 3.2* 2.7* 3.5    Recent Labs  02/28/15 1433  LIPASE  36   No results for input(s): AMMONIA in the last 8760 hours. CBC:  Recent Labs  02/28/15 1433  03/01/15 0619 03/10/15 2324 03/11/15 0847  WBC 9.5  < > 3.3* 13.1* 8.2  NEUTROABS 7.5  --   --  11.1* 6.5  HGB 14.5  < > 13.1 15.6 13.6  HCT 43.3  < > 40.0 46.6 40.8  MCV 96.7  < > 96.6 95.7 96.2  PLT 322  < > 306 384 318  < > = values in this interval not displayed. TSH:  Recent Labs  02/28/15 1854 02/28/15 2144 03/11/15 0847  TSH 1.426 1.356 2.212   A1C: No results found for: HGBA1C Lipid Panel: No results for input(s): CHOL, HDL, LDLCALC, TRIG,  CHOLHDL, LDLDIRECT in the last 8760 hours. CBC with Diff    Result: 03/27/2015 9:22 PM   ( Status: F )       WBC 7.9     4.0-10.5 K/uL SLN   RBC 4.39     4.22-5.81 MIL/uL SLN   Hemoglobin 14.2     13.0-17.0 g/dL SLN   Hematocrit 43.7     39.0-52.0 % SLN   MCV 99.5     78.0-100.0 fL SLN   MCH 32.3     26.0-34.0 pg SLN   MCHC 32.5     30.0-36.0 g/dL SLN   RDW 12.5     11.5-15.5 % SLN   Platelet Count 319     150-400 K/uL SLN   MPV 10.4     8.6-12.4 fL SLN   Granulocyte % 74     43-77 % SLN   Absolute Gran 5.8     1.7-7.7 K/uL SLN   Lymph % 14     12-46 % SLN   Absolute Lymph 1.1     0.7-4.0 K/uL SLN   Mono % 9     3-12 % SLN   Absolute Mono 0.7     0.1-1.0 K/uL SLN   Eos % 2     0-5 % SLN   Absolute Eos 0.2     0.0-0.7 K/uL SLN   Baso % 1     0-1 % SLN   Absolute Baso 0.1     0.0-0.1 K/uL SLN   Smear Review Criteria for review not met  SLN   Basic Metabolic Panel    Result: 03/27/2015 8:57 PM   ( Status: F )       Sodium 138     135-145 mEq/L SLN   Potassium 5.0     3.5-5.3 mEq/L SLN   Chloride 89   L 96-112 mEq/L SLN   CO2 37   H 19-32 mEq/L SLN   Glucose 108   H 70-99 mg/dL SLN   BUN 17     6-23 mg/dL SLN   Creatinine 0.69     0.50-1.35 mg/dL SLN   Calcium 9.2     8.4-10.5 mg/dL SLN Assessment/Plan 1. Hyponatremia Improved on recent labs, to cont fluid restriction   2. Constipation, unspecified constipation  type -reports no BM in 4-5 days, pt currently on colace BID and miralax BID with MOM and suppository -will add linzess 145 mcg daily and cont colace but change miralax to as needed once pt has BM   3. Chronic obstructive pulmonary disease, unspecified COPD, unspecified chronic bronchitis type -stable, conts on o2 at 2L   4. Psychosis, unspecified psychosis type Pt with paranoia and hallucinations, started on Seroquel 25 mg qhs with improvement in symptoms   5. Abdominal aortic aneurysm per CAT scan chronic - further workup as outpatient.  6. Debility pt is stable for discharge-will need PT/OT per home health. No DME needed. Rx written.  will need to follow up with PCP within 2 weeks.    Carlos American. Harle Battiest  [facility] [phone] 8 am - 5 pm) [phone] (after hours)

## 2015-04-13 ENCOUNTER — Telehealth: Payer: Self-pay

## 2015-04-13 NOTE — Telephone Encounter (Signed)
Kathlene NovemberMike Register from [facility] to see if Dr. Cleta Albertsaub is willing to sign home health orders until the patient established a new PCP. Mike's phone: [phone]

## 2015-04-14 NOTE — Telephone Encounter (Signed)
Yes I would be happy to sign his papers

## 2015-04-14 NOTE — Telephone Encounter (Signed)
Called X 3 and phone has busy signal.

## 2015-04-21 ENCOUNTER — Ambulatory Visit: Payer: Medicare Other | Admitting: Emergency Medicine

## 2015-04-25 ENCOUNTER — Emergency Department (HOSPITAL_COMMUNITY): Payer: Medicare Other

## 2015-04-25 ENCOUNTER — Inpatient Hospital Stay (HOSPITAL_COMMUNITY)
Admission: EM | Admit: 2015-04-25 | Discharge: 2015-04-28 | DRG: 189 | Disposition: A | Discharge status: Skilled Nursing Facility | Payer: Medicare Other | Attending: Family Medicine | Admitting: Family Medicine

## 2015-04-25 ENCOUNTER — Encounter (HOSPITAL_COMMUNITY): Payer: Self-pay | Admitting: Emergency Medicine

## 2015-04-25 ENCOUNTER — Ambulatory Visit (INDEPENDENT_AMBULATORY_CARE_PROVIDER_SITE_OTHER): Payer: Medicare Other | Admitting: Emergency Medicine

## 2015-04-25 VITALS — BP 82/79 | HR 81 | Temp 97.8°F | Resp 18 | Ht 71.0 in | Wt 130.0 lb

## 2015-04-25 DIAGNOSIS — R5381 Other malaise: Secondary | ICD-10-CM | POA: Diagnosis present

## 2015-04-25 DIAGNOSIS — Z9981 Dependence on supplemental oxygen: Secondary | ICD-10-CM

## 2015-04-25 DIAGNOSIS — I714 Abdominal aortic aneurysm, without rupture, unspecified: Secondary | ICD-10-CM | POA: Diagnosis present

## 2015-04-25 DIAGNOSIS — R627 Adult failure to thrive: Secondary | ICD-10-CM | POA: Diagnosis present

## 2015-04-25 DIAGNOSIS — J9611 Chronic respiratory failure with hypoxia: Secondary | ICD-10-CM | POA: Diagnosis not present

## 2015-04-25 DIAGNOSIS — E871 Hypo-osmolality and hyponatremia: Secondary | ICD-10-CM | POA: Diagnosis present

## 2015-04-25 DIAGNOSIS — F17210 Nicotine dependence, cigarettes, uncomplicated: Secondary | ICD-10-CM | POA: Diagnosis present

## 2015-04-25 DIAGNOSIS — I2781 Cor pulmonale (chronic): Secondary | ICD-10-CM | POA: Diagnosis present

## 2015-04-25 DIAGNOSIS — J9620 Acute and chronic respiratory failure, unspecified whether with hypoxia or hypercapnia: Secondary | ICD-10-CM

## 2015-04-25 DIAGNOSIS — R0602 Shortness of breath: Secondary | ICD-10-CM | POA: Diagnosis not present

## 2015-04-25 DIAGNOSIS — E872 Acidosis: Secondary | ICD-10-CM | POA: Diagnosis present

## 2015-04-25 DIAGNOSIS — E785 Hyperlipidemia, unspecified: Secondary | ICD-10-CM | POA: Diagnosis present

## 2015-04-25 DIAGNOSIS — J9621 Acute and chronic respiratory failure with hypoxia: Principal | ICD-10-CM | POA: Diagnosis present

## 2015-04-25 DIAGNOSIS — Z7982 Long term (current) use of aspirin: Secondary | ICD-10-CM

## 2015-04-25 DIAGNOSIS — Z681 Body mass index (BMI) 19 or less, adult: Secondary | ICD-10-CM

## 2015-04-25 DIAGNOSIS — I240 Acute coronary thrombosis not resulting in myocardial infarction: Secondary | ICD-10-CM | POA: Diagnosis present

## 2015-04-25 DIAGNOSIS — E43 Unspecified severe protein-calorie malnutrition: Secondary | ICD-10-CM | POA: Diagnosis present

## 2015-04-25 DIAGNOSIS — I959 Hypotension, unspecified: Secondary | ICD-10-CM | POA: Diagnosis present

## 2015-04-25 DIAGNOSIS — Z720 Tobacco use: Secondary | ICD-10-CM

## 2015-04-25 DIAGNOSIS — E222 Syndrome of inappropriate secretion of antidiuretic hormone: Secondary | ICD-10-CM | POA: Insufficient documentation

## 2015-04-25 DIAGNOSIS — Z9861 Coronary angioplasty status: Secondary | ICD-10-CM | POA: Diagnosis not present

## 2015-04-25 DIAGNOSIS — J449 Chronic obstructive pulmonary disease, unspecified: Secondary | ICD-10-CM | POA: Diagnosis not present

## 2015-04-25 DIAGNOSIS — I10 Essential (primary) hypertension: Secondary | ICD-10-CM | POA: Diagnosis present

## 2015-04-25 DIAGNOSIS — F17200 Nicotine dependence, unspecified, uncomplicated: Secondary | ICD-10-CM

## 2015-04-25 DIAGNOSIS — J439 Emphysema, unspecified: Secondary | ICD-10-CM | POA: Diagnosis not present

## 2015-04-25 DIAGNOSIS — J441 Chronic obstructive pulmonary disease with (acute) exacerbation: Secondary | ICD-10-CM

## 2015-04-25 DIAGNOSIS — K5900 Constipation, unspecified: Secondary | ICD-10-CM | POA: Diagnosis present

## 2015-04-25 HISTORY — DX: Acute and chronic respiratory failure, unspecified whether with hypoxia or hypercapnia: J96.20

## 2015-04-25 LAB — COMPREHENSIVE METABOLIC PANEL
ALBUMIN: 2.8 g/dL — AB (ref 3.5–5.0)
ALK PHOS: 95 U/L (ref 38–126)
ALT: 15 U/L — ABNORMAL LOW (ref 17–63)
ANION GAP: 4 — AB (ref 5–15)
AST: 21 U/L (ref 15–41)
BILIRUBIN TOTAL: 0.5 mg/dL (ref 0.3–1.2)
BUN: 19 mg/dL (ref 6–20)
CHLORIDE: 90 mmol/L — AB (ref 101–111)
CO2: 38 mmol/L — AB (ref 22–32)
CREATININE: 0.75 mg/dL (ref 0.61–1.24)
Calcium: 8.3 mg/dL — ABNORMAL LOW (ref 8.9–10.3)
GFR calc Af Amer: >60 mL/min (ref >60)
GLUCOSE: 101 mg/dL — AB (ref 65–99)
Potassium: 4.6 mmol/L (ref 3.5–5.1)
Sodium: 132 mmol/L — ABNORMAL LOW (ref 135–145)
Total Protein: 5.9 g/dL — ABNORMAL LOW (ref 6.5–8.1)

## 2015-04-25 LAB — CBC WITH DIFFERENTIAL/PLATELET
BASOS ABS: 0 10*3/uL (ref 0.0–0.1)
BASOS PCT: 0 % (ref 0–1)
EOS ABS: 0.1 10*3/uL (ref 0.0–0.7)
Eosinophils Relative: 2 % (ref 0–5)
HCT: 36.8 % — ABNORMAL LOW (ref 39.0–52.0)
Hemoglobin: 11.3 g/dL — ABNORMAL LOW (ref 13.0–17.0)
Lymphocytes Relative: 13 % (ref 12–46)
Lymphs Abs: 1 10*3/uL (ref 0.7–4.0)
MCH: 32.3 pg (ref 26.0–34.0)
MCHC: 30.7 g/dL (ref 30.0–36.0)
MCV: 105.1 fL — ABNORMAL HIGH (ref 78.0–100.0)
MONOS PCT: 10 % (ref 3–12)
Monocytes Absolute: 0.8 10*3/uL (ref 0.1–1.0)
NEUTROS ABS: 5.8 10*3/uL (ref 1.7–7.7)
Neutrophils Relative %: 75 % (ref 43–77)
Platelets: 312 10*3/uL (ref 150–400)
RBC: 3.5 MIL/uL — ABNORMAL LOW (ref 4.22–5.81)
RDW: 12.6 % (ref 11.5–15.5)
WBC: 7.7 10*3/uL (ref 4.0–10.5)

## 2015-04-25 LAB — I-STAT ARTERIAL BLOOD GAS, ED
Acid-Base Excess: 12 mmol/L — ABNORMAL HIGH (ref 0.0–2.0)
BICARBONATE: 40.9 meq/L — AB (ref 20.0–24.0)
O2 Saturation: 96 %
PCO2 ART: 77.8 mmHg — AB (ref 35.0–45.0)
TCO2: 43 mmol/L (ref 0–100)
pH, Arterial: 7.329 — ABNORMAL LOW (ref 7.350–7.450)
pO2, Arterial: 92 mmHg (ref 80.0–100.0)

## 2015-04-25 LAB — TROPONIN I: Troponin I: <0.03 ng/mL (ref <0.031)

## 2015-04-25 LAB — BRAIN NATRIURETIC PEPTIDE: B Natriuretic Peptide: 25 pg/mL (ref 0.0–100.0)

## 2015-04-25 MED ORDER — SODIUM CHLORIDE 0.9 % IJ SOLN: 3.0000 mL | INTRAMUSCULAR | Status: DC | PRN

## 2015-04-25 MED ORDER — NICOTINE 21 MG/24HR TD PT24
21.0000 mg | MEDICATED_PATCH | TRANSDERMAL | Status: DC
  Administered 2015-04-26 – 2015-04-28 (×3): 21 mg via TRANSDERMAL
  Filled 2015-04-25 (×3): qty 1

## 2015-04-25 MED ORDER — ENOXAPARIN SODIUM 30 MG/0.3ML ~~LOC~~ SOLN
30.0000 mg | SUBCUTANEOUS | Status: DC
  Administered 2015-04-25: 30 mg via SUBCUTANEOUS
  Filled 2015-04-25: qty 0.3

## 2015-04-25 MED ORDER — ALBUTEROL (5 MG/ML) CONTINUOUS INHALATION SOLN
10.0000 mg/h | INHALATION_SOLUTION | Freq: Once | RESPIRATORY_TRACT | Status: AC
  Administered 2015-04-25: 10 mg/h via RESPIRATORY_TRACT
  Filled 2015-04-25: qty 20

## 2015-04-25 MED ORDER — IPRATROPIUM-ALBUTEROL 0.5-2.5 (3) MG/3ML IN SOLN
3.0000 mL | Freq: Four times a day (QID) | RESPIRATORY_TRACT | Status: DC
  Filled 2015-04-25: qty 3

## 2015-04-25 MED ORDER — SODIUM CHLORIDE 0.9 % IV SOLN: 250.0000 mL | INTRAVENOUS | Status: DC | PRN

## 2015-04-25 MED ORDER — METHYLPREDNISOLONE SODIUM SUCC 125 MG IJ SOLR
125.0000 mg | Freq: Once | INTRAMUSCULAR | Status: AC
  Administered 2015-04-25: 125 mg via INTRAVENOUS
  Filled 2015-04-25: qty 2

## 2015-04-25 MED ORDER — NICOTINE 7 MG/24HR TD PT24
7.0000 mg | MEDICATED_PATCH | TRANSDERMAL | Status: DC
Start: 2015-04-25 — End: 2015-04-25
  Administered 2015-04-25: 7 mg via TRANSDERMAL
  Filled 2015-04-25: qty 1

## 2015-04-25 MED ORDER — SODIUM CHLORIDE 0.9 % IJ SOLN
3.0000 mL | Freq: Two times a day (BID) | INTRAMUSCULAR | Status: DC
  Administered 2015-04-26 – 2015-04-27 (×4): 3 mL via INTRAVENOUS

## 2015-04-25 MED ORDER — ALBUTEROL SULFATE (2.5 MG/3ML) 0.083% IN NEBU: 2.5000 mg | INHALATION_SOLUTION | RESPIRATORY_TRACT | Status: DC | PRN

## 2015-04-25 MED ORDER — SODIUM CHLORIDE 0.9 % IJ SOLN
3.0000 mL | Freq: Two times a day (BID) | INTRAMUSCULAR | Status: DC
  Administered 2015-04-25: 3 mL via INTRAVENOUS

## 2015-04-25 MED ORDER — DOCUSATE SODIUM 100 MG PO CAPS
100.0000 mg | ORAL_CAPSULE | Freq: Two times a day (BID) | ORAL | Status: DC
  Administered 2015-04-25 – 2015-04-28 (×5): 100 mg via ORAL
  Filled 2015-04-25 (×6): qty 1

## 2015-04-25 NOTE — ED Notes (Signed)
Per EMS: from [facility] c/[facility], has been at [facility], just got out in past 3 weeks.  Compliance with medications is uncertain, has dropped 30 pounds in last year. Peaked twaves on ecg with ems, end stage COPD, pursed lips, sats at 90s, no c/o sob, will at times drop to 75.  On  2 liters, 4 with ems, 90s throughout transport.  20 gauge left hand.  Called out for weakness and hypotension.  lung sounds very diminished possibly at baseline.

## 2015-04-25 NOTE — ED Provider Notes (Signed)
CSN: [id number]     Arrival date & time 04/25/15  1549 History   First MD Initiated Contact with Patient 04/25/15 1556     Chief Complaint  Patient presents with  . Shortness of Breath     (Consider location/radiation/quality/duration/timing/severity/associated sxs/prior Treatment) HPI Comments: Patient presents to the emergency department for evaluation of shortness of breath. Patient reportedly was hospitalized for an extended period of time and then had a three-week stay in rehabilitation nursing home. Patient just left the nursing home. He is on oxygen at home. He reports that he went to see Dr. Cleta Alberts for hospital follow-up and was noted to be very hypoxic. Patient was transferred to the emergency room for further evaluation. He does report that he has had a persistent cough, has been feeling weak. He does not feel like he is more short of breath than usual. He has been on oxygen continuously since May, no changes. Patient had his nasal cannula O2 increased during transport and his oxygen saturations have improved. He is not experiencing any chest pain. He has not had any fever.  Patient is a 74 y.o. male presenting with shortness of breath.  Shortness of Breath   Past Medical History  Diagnosis Date  . Hypertension   . High cholesterol   . Hyponatremia syndrome   . Chronic cor pulmonale   . COPD (chronic obstructive pulmonary disease)   . Aortic aneurysm   . Chronic respiratory failure with hypoxia 03/01/2015   Past Surgical History  Procedure Laterality Date  . Angioplasty    . Left heart catheterization with coronary angiogram N/A 10/23/2012    Procedure: LEFT HEART CATHETERIZATION WITH CORONARY ANGIOGRAM;  Surgeon: [name], MD;  Location: [facility] CATH LAB;  Service: Cardiovascular;  Laterality: N/A;  . Abdominal angiogram N/A 10/23/2012    Procedure: ABDOMINAL ANGIOGRAM;  Surgeon: [name], MD;  Location: [facility] CATH LAB;  Service: Cardiovascular;  Laterality: N/A;    History reviewed. No pertinent family history. History  Substance Use Topics  . 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 Smokeless tobacco: Never Used  . Alcohol Use: No    Review of Systems  Constitutional: Positive for fatigue.  Respiratory: Positive for shortness of breath.   All other systems reviewed and are negative.     Allergies  Review of patient's allergies indicates no known allergies.  Home Medications   Prior to Admission medications   Medication Sig Start Date End Date Taking? Authorizing Provider  aspirin EC 81 MG tablet Take 81 mg by mouth daily.   Yes Historical Provider, MD  metoprolol (LOPRESSOR) 50 MG tablet Take 50 mg by mouth 2 (two) times daily.  12/31/14  Yes Historical Provider, MD  alum & mag hydroxide-simeth (MAALOX/MYLANTA) 200-200-20 MG/5ML suspension Take 15 mLs by mouth every 6 (six) hours as needed for indigestion or heartburn. 03/17/15   Joseph Art, DO  antiseptic oral rinse (CPC / CETYLPYRIDINIUM CHLORIDE 0.05%) 0.05 % LIQD solution 7 mLs by Mouth Rinse route 2 (two) times daily. 03/17/15   Joseph Art, DO  bisacodyl (DULCOLAX) 10 MG suppository Place 1 suppository (10 mg total) rectally daily as needed for moderate constipation. 03/17/15   Joseph Art, DO  cyclobenzaprine (FLEXERIL) 5 MG tablet Take 5 mg by mouth 3 (three) times daily as needed. 03/01/15   Historical Provider, MD  docusate sodium (COLACE) 100 MG capsule Take 1 capsule (100 mg total)  by mouth 2 (two) times daily. 03/17/15   Joseph ArtJessica U Vann, DO  feeding supplement, RESOURCE BREEZE, (RESOURCE BREEZE) LIQD Take 1 Container by mouth 3 (three) times daily between meals. 03/17/15   Joseph ArtJessica U Vann, DO  HYDROcodone-acetaminophen (NORCO) 5-325 MG per tablet Take 1 tablet by mouth every 6 (six) hours as needed for moderate pain. 03/17/15   Joseph ArtJessica U Vann, DO  Linaclotide (LINZESS) 145 MCG CAPS capsule Take 1 capsule (145 mcg total) by  mouth daily. 04/03/15   Sharon SellerJessica K Eubanks, NP  nicotine (NICODERM CQ - DOSED IN MG/24 HOURS) 14 mg/24hr patch Place 1 patch (14 mg total) onto the skin daily. Patient not taking: Reported on 04/25/2015 03/17/15   Selinda OrionJessica U Vann, DO  ondansetron (ZOFRAN) 4 MG tablet Take 1 tablet (4 mg total) by mouth every 6 (six) hours as needed for nausea. 03/17/15   Joseph ArtJessica U Vann, DO  pantoprazole (PROTONIX) 40 MG tablet Take 1 tablet (40 mg total) by mouth daily. 03/17/15   Joseph ArtJessica U Vann, DO  polyethylene glycol (MIRALAX / GLYCOLAX) packet Take 17 g by mouth daily. 03/17/15   Joseph ArtJessica U Vann, DO  QUEtiapine (SEROQUEL) 25 MG tablet Take 25 mg by mouth at bedtime.    Historical Provider, MD  simethicone (MYLICON) 80 MG chewable tablet Chew 1 tablet (80 mg total) by mouth every 6 (six) hours as needed for flatulence. 03/17/15   Joseph ArtJessica U Vann, DO   BP 119/75 mmHg  Pulse 85  Temp(Src) 98.1 F (36.7 C) (Oral)  Resp 18  Ht 5\' 11"  (1.803 m)  Wt 134 lb (60.782 kg)  BMI 18.70 kg/m2  SpO2 96% Physical Exam  Constitutional: He is oriented to person, place, and time. He appears well-developed and well-nourished. No distress.  HENT:  Head: Normocephalic and atraumatic.  Right Ear: Hearing normal.  Left Ear: Hearing normal.  Nose: Nose normal.  Mouth/Throat: Oropharynx is clear and moist and mucous membranes are normal.  Eyes: Conjunctivae and EOM are normal. Pupils are equal, round, and reactive to light.  Neck: Normal range of motion. Neck supple.  Cardiovascular: Regular rhythm, S1 normal and S2 normal.  Exam reveals no gallop and no friction rub.   No murmur heard. Pulmonary/Chest: Accessory muscle usage present. Tachypnea noted. No respiratory distress. He has decreased breath sounds. He has wheezes. He exhibits no tenderness.  Abdominal: Soft. Normal appearance and bowel sounds are normal. There is no hepatosplenomegaly. There is no tenderness. There is no rebound, no guarding, no tenderness at McBurney's point  and negative Murphy's sign. No hernia.  Musculoskeletal: Normal range of motion. He exhibits edema.  Neurological: He is alert and oriented to person, place, and time. He has normal strength. No cranial nerve deficit or sensory deficit. Coordination normal. GCS eye subscore is 4. GCS verbal subscore is 5. GCS motor subscore is 6.  Skin: Skin is warm, dry and intact. No rash noted. No cyanosis.  Psychiatric: He has a normal mood and affect. His speech is normal and behavior is normal. Thought content normal.  Nursing note and vitals reviewed.   ED Course  Procedures (including critical care time) Labs Review Labs Reviewed  CBC WITH DIFFERENTIAL/PLATELET - Abnormal; Notable for the following:    RBC 3.50 (*)    Hemoglobin 11.3 (*)    HCT 36.8 (*)    MCV 105.1 (*)    All other components within normal limits  COMPREHENSIVE METABOLIC PANEL - Abnormal; Notable for the following:    Sodium 132 (*)  Chloride 90 (*)    CO2 38 (*)    Glucose, Bld 101 (*)    Calcium 8.3 (*)    Total Protein 5.9 (*)    Albumin 2.8 (*)    ALT 15 (*)    Anion gap 4 (*)    All other components within normal limits  I-STAT ARTERIAL BLOOD GAS, ED - Abnormal; Notable for the following:    pH, Arterial 7.329 (*)    pCO2 arterial 77.8 (*)    Bicarbonate 40.9 (*)    Acid-Base Excess 12.0 (*)    All other components within normal limits  TROPONIN I  BRAIN NATRIURETIC PEPTIDE    Imaging Review Dg Chest 2 View  04/25/2015   CLINICAL DATA:  Shortness of breath.  EXAM: CHEST  2 VIEW  COMPARISON:  Radiograph of same day.  CT scan of Feb 28, 2015.  FINDINGS: The heart size and mediastinal contours are within normal limits. No pneumothorax or pleural effusion is noted. Hyperexpansion of the lungs is noted suggesting chronic obstructive pulmonary disease. No acute pulmonary disease is noted. Opacity seen over right upper lobe on prior exam is no longer present, and presumably represented external artifact. There is  noted severe compression deformity of mid thoracic vertebral body which was not present on prior CT scan, and is concerning for acute compression fracture.  IMPRESSION: Findings consistent with chronic obstructive pulmonary disease. No acute pulmonary disease is noted.  Interval development of severe compression deformity of mid thoracic vertebral body concerning for acute compression fracture.   Electronically Signed   By: Lupita Raider, M.D.   On: 04/25/2015 18:16   Dg Chest Port 1 View  04/25/2015   CLINICAL DATA:  Shortness of breath.  EXAM: PORTABLE CHEST - 1 VIEW  COMPARISON:  Mar 11, 2015.  FINDINGS: The heart size and mediastinal contours are within normal limits. No pneumothorax or pleural effusion is noted. Bony thorax appears intact. Left lung is clear. Mild right upper lobe opacity is noted with overlying external object ; underlying pneumonia cannot be excluded. The visualized skeletal structures are unremarkable.  IMPRESSION: Mild right upper lobe opacity is noted, but there is an overlying external object which may be creating this as artifact. However, focal pneumonia cannot be excluded and repeat radiograph without overlying external objects is recommended.   Electronically Signed   By: Lupita Raider, M.D.   On: 04/25/2015 17:17     EKG Interpretation   Date/Time:  Saturday April 25 2015 16:03:16 EDT Ventricular Rate:  79 PR Interval:  123 QRS Duration: 69 QT Interval:  347 QTC Calculation: 398 R Axis:   29 Text Interpretation:  Sinus rhythm RSR' in V1 or V2, probably normal  variant No significant change since last tracing Confirmed by [facility]  MD,  CHRISTOPHER [id number]) on 04/25/2015 4:32:03 PM      MDM   Final diagnoses:  Shortness of breath  Acute on chronic respiratory failure, unspecified whether with hypoxia or hypercapnia  Chronic obstructive pulmonary disease, unspecified COPD, unspecified chronic bronchitis type   Patient presented to the ER for evaluation of  difficulty breathing and hypoxia. Patient has a history of severe COPD. He presented to his primary doctor today for follow-up after being discharged from rehabilitation. He was noted to be profoundly hypoxic and hypotensive. Patient does have a significant persistent cough. The ER. He is not expressing any chest pain. Premature oxygen was increased to 3.5 L and he has improved. He is normally on  2 L at home. Cardiac evaluation is negative. Patient does have evidence of acute on chronic respiratory failure with CO2 retention. Chest x-ray did not show pneumonia. He feels better after a continuous nebulizer treatment and Solu-Medrol. He will require hospitalization for further management.    Gilda Crease, MD 04/25/15 [phone]

## 2015-04-25 NOTE — ED Notes (Signed)
Saw dr for follow up today, told O2 sat was low.  Sent here for further evaluation.  On O2 @ 2L since May, 2016.

## 2015-04-25 NOTE — H&P (Signed)
[facility] Admission History and Physical Service Pager: [phone]  Patient name: Colton Owens Medical record number: [id number] Date of birth: [birth year] Age: 74 y.o. Gender: male  Primary Care Provider: Lucilla EdinAUB, STEVE A, MD Consultants: None Code Status: FULL  Chief Complaint: shortness of breath  Assessment and Plan: Colton Owens is a 74 y.o. male presenting with shortness of breath . PMH is significant for COPD, HTN, HLD and AAA.   Acute on chronic respiratory failure due to COPD: Worsened hypoxemia (3.5L O2 requirement) on poor baseline pulmonary status (2L home-dependence). Also chronically hypercapneic (incompletely compensated respiratory acidosis on ABG). Continues to smoke (probable trigger). CXR without evidence of PNA or volume overload, BNP normal. Troponin negative, ECG stable and nonischemic. Doubt PE.  - Admit to FMTS, Dr. Gwendolyn GrantWalden, on telemetry for observation - Duonebs q6h / albuterol neb q3h prn - Oxygen by nasal cannula prn hypoxemia - Given solumedrol x1, complete 5 day course with prednisone 40mg  daily x 4 days - Low threshold (AMS, arrhythmia, respiratory distress) for ABG - Per COPD gold protocol, consults to care management and social work have been placed - Tobacco cessation counseling provided, MAR states nicotine patch 14mg  but pt requests 21mg  which has been ordered.   Failure to thrive: Poor functional status worsening while living at home following 21-day SNF admission.  - PT eval and treat - CSW consulted as dispo to NH is likely safest option. Pt's daughter and son-in-law agree.   HTN/cor pulmonale: Hold home anti-hypertensives (metoprolol) given tenuous BPs in ED.  - Continue ASA 81mg   Chronic hyponatremia: Na 132, stable/above normal baseline. - Monitor  AAA: Stable 37mm with mural thrombus on CT Abd 03/11/2015.  - No intervention planned.  Constipation: Pt amenable to stool softeners but no laxatives.  - Colace  BID  FEN/GI: Saline lock, regular diet Prophylaxis: Lovenox  Disposition: admit with telemetry  History of Present Illness: Colton Owens is a 74 y.o. male presenting with shortness of breath.   Colton Owens was hospitalized on May 24 for COPD exacerbation with three subsequent weeks of [facility]. At a follow-up appointment with Dr. Cleta Albertsaub today, his O2 sat on his home 2L [place] was 80% with BP 82/79. EMS was then called and he was transported to the ED.  Colton Owens reports feeling short of breath, with a cough productive of yellow sputum. He also reports that his feet have been swelling recently, but says that happens every summer. His breathing did improve after hospitalization, but he started to smoke again and began to cough. He continues to smoke >0.5 packs per day. He reports that he does not use any inhalers at home. The patient lives alone, and his daughter and son-in-law do not feel that he is able to care for himself anymore. However, he does not want to go back to an assisted living facility.   Review Of Systems: Per HPI with the following additions: endorses constipation; denies chest pain, palpitations, abdominal pain, N/V/D, fever Otherwise 12 point review of systems was performed and was unremarkable.  Patient Active Problem List   Diagnosis Date Noted  . Acute on chronic respiratory failure 04/25/2015  . Psychosis 03/28/2015  . Protein-calorie malnutrition, severe 03/12/2015  . Nausea & vomiting 03/11/2015  . CN (constipation) 03/11/2015  . Chronic respiratory failure with hypoxia 03/01/2015  . COPD (chronic obstructive pulmonary disease) 03/01/2015  . Debility 03/01/2015  . Bilateral flank pain   . Hyponatremia 02/28/2015  .  Bilateral lower extremity edema 03/01/2013  . Essential hypertension, benign 09/09/2012  . Other and unspecified hyperlipidemia 09/09/2012  . Syncope and collapse 09/09/2012  . Abdominal aortic aneurysm 08/29/2012   Past  Medical History: Past Medical History  Diagnosis Date  . Hypertension   . High cholesterol   . Hyponatremia syndrome   . Chronic cor pulmonale   . COPD (chronic obstructive pulmonary disease)   . Aortic aneurysm   . Chronic respiratory failure with hypoxia 03/01/2015   Past Surgical History: Past Surgical History  Procedure Laterality Date  . Angioplasty    . Left heart catheterization with coronary angiogram N/A 10/23/2012    Procedure: LEFT HEART CATHETERIZATION WITH CORONARY ANGIOGRAM;  Surgeon: [name], MD;  Location: [facility] CATH LAB;  Service: Cardiovascular;  Laterality: N/A;  . Abdominal angiogram N/A 10/23/2012    Procedure: ABDOMINAL ANGIOGRAM;  Surgeon: [name], MD;  Location: [facility] CATH LAB;  Service: Cardiovascular;  Laterality: N/A;   Social History: History  Substance Use Topics  . 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 Smokeless tobacco: Never Used  . Alcohol Use: No   Additional social history: smokes >0.5 packs per day Please also refer to relevant sections of EMR.  Family History: History reviewed. No pertinent family history. Allergies and Medications: No Known Allergies No current facility-administered medications on file prior to encounter.   Current Outpatient Prescriptions on File Prior to Encounter  Medication Sig Dispense Refill  . feeding supplement, RESOURCE BREEZE, (RESOURCE BREEZE) LIQD Take 1 Container by mouth 3 (three) times daily between meals.  0  . HYDROcodone-acetaminophen (NORCO) 5-325 MG per tablet Take 1 tablet by mouth every 6 (six) hours as needed for moderate pain. 15 tablet 0  . metoprolol (LOPRESSOR) 50 MG tablet Take 50 mg by mouth 2 (two) times daily.   1  . alum & mag hydroxide-simeth (MAALOX/MYLANTA) 200-200-20 MG/5ML suspension Take 15 mLs by mouth every 6 (six) hours as needed for indigestion or heartburn. (Patient not taking: Reported on 04/25/2015) 355 mL 0  .  antiseptic oral rinse (CPC / CETYLPYRIDINIUM CHLORIDE 0.05%) 0.05 % LIQD solution 7 mLs by Mouth Rinse route 2 (two) times daily. (Patient not taking: Reported on 04/25/2015)  0  . bisacodyl (DULCOLAX) 10 MG suppository Place 1 suppository (10 mg total) rectally daily as needed for moderate constipation. (Patient not taking: Reported on 04/25/2015) 12 suppository 0  . docusate sodium (COLACE) 100 MG capsule Take 1 capsule (100 mg total) by mouth 2 (two) times daily. (Patient not taking: Reported on 04/25/2015) 10 capsule 0  . Linaclotide (LINZESS) 145 MCG CAPS capsule Take 1 capsule (145 mcg total) by mouth daily. (Patient not taking: Reported on 04/25/2015) 30 capsule   . nicotine (NICODERM CQ - DOSED IN MG/24 HOURS) 14 mg/24hr patch Place 1 patch (14 mg total) onto the skin daily. (Patient not taking: Reported on 04/25/2015) 28 patch 0  . ondansetron (ZOFRAN) 4 MG tablet Take 1 tablet (4 mg total) by mouth every 6 (six) hours as needed for nausea. (Patient not taking: Reported on 04/25/2015) 20 tablet 0  . pantoprazole (PROTONIX) 40 MG tablet Take 1 tablet (40 mg total) by mouth daily. (Patient not taking: Reported on 04/25/2015)    . polyethylene glycol (MIRALAX / GLYCOLAX) packet Take 17 g by mouth daily. (Patient not taking: Reported on 04/25/2015) 14 each 0  . simethicone (MYLICON) 80 MG chewable tablet Chew 1  tablet (80 mg total) by mouth every 6 (six) hours as needed for flatulence. (Patient not taking: Reported on 04/25/2015) 30 tablet 0    Objective: BP 111/67 mmHg  Pulse 100  Temp(Src) 98.4 F (36.9 C) (Oral)  Resp 18  Ht  (1.803 m)  Wt 134 lb (60.782 kg)  BMI 18.70 kg/m2  SpO2 99% Exam: General: thin elderly male sitting up in bed  ENTM: Poor dentition, MMM, oropharynx clear Neck: Full ROM Cardiovascular: Regular rate, no murmur, no JVD, 1+ pitting edema to upper ankles bilaterally Respiratory: Mildly increased WOB, able to state all ABCs in 1 breath, rate normal, 96% on 3.5L drops to 90%  when speaking.  Abdomen: +BS, soft, NT, ND MSK: No deformities Skin: Some digital cyanosis without clubbing. No wounds.  Neuro: Alert and oriented, no focal deficits, romberg neg, slow motions without focal deficit in strength Psych: Normal mood and congruent affect.   Labs and Imaging: CBC BMET   Recent Labs Lab 04/25/15 1624  WBC 7.7  HGB 11.3*  HCT 36.8*  PLT 312    Recent Labs Lab 04/25/15 1624  NA 132*  K 4.6  CL 90*  CO2 38*  BUN 19  CREATININE 0.75  GLUCOSE 101*  CALCIUM 8.3*     Dg Chest 2 View  04/25/2015   CLINICAL DATA:  Shortness of breath.  EXAM: CHEST  2 VIEW  COMPARISON:  Radiograph of same day.  CT scan of Feb 28, 2015.  FINDINGS: The heart size and mediastinal contours are within normal limits. No pneumothorax or pleural effusion is noted. Hyperexpansion of the lungs is noted suggesting chronic obstructive pulmonary disease. No acute pulmonary disease is noted. Opacity seen over right upper lobe on prior exam is no longer present, and presumably represented external artifact. There is noted severe compression deformity of mid thoracic vertebral body which was not present on prior CT scan, and is concerning for acute compression fracture.  IMPRESSION: Findings consistent with chronic obstructive pulmonary disease. No acute pulmonary disease is noted.  Interval development of severe compression deformity of mid thoracic vertebral body concerning for acute compression fracture.   Electronically Signed   By: Lupita Raider, M.D.   On: 04/25/2015 18:16   Dg Chest Port 1 View  04/25/2015   CLINICAL DATA:  Shortness of breath.  EXAM: PORTABLE CHEST - 1 VIEW  COMPARISON:  Mar 11, 2015.  FINDINGS: The heart size and mediastinal contours are within normal limits. No pneumothorax or pleural effusion is noted. Bony thorax appears intact. Left lung is clear. Mild right upper lobe opacity is noted with overlying external object ; underlying pneumonia cannot be excluded. The  visualized skeletal structures are unremarkable.  IMPRESSION: Mild right upper lobe opacity is noted, but there is an overlying external object which may be creating this as artifact. However, focal pneumonia cannot be excluded and repeat radiograph without overlying external objects is recommended.   Electronically Signed   By: Lupita Raider, M.D.   On: 04/25/2015 17:17     Marquette Saa, MD 04/25/2015, 9:15 PM PGY-1, [facility] Family Medicine FPTS Intern pager: [phone], text pages welcome  I have seen and evaluated the patient with Dr. Natale Milch. I have completed her note as above.  Ryan B. Jarvis Newcomer, MD, PGY-3 04/25/2015 11:45 PM

## 2015-04-25 NOTE — Progress Notes (Addendum)
This chart was scribed for Lesle Chris, MD by Jolene Provost, Medical Scribe. This patient was seen in Room 4 and the patient's care was started at 2:42 PM.  Chief Complaint:  Chief Complaint  Patient presents with  . Follow-up    hospital follow up    HPI: Colton Owens is a 74 y.o. male with a past hx of COPD, AAA, and HTN who reports to [facility] reporting for a follow up after a hospitalization on May, 24th followed by three weeks of rehabilitation in a nursing home. Pt is in a wheelchair and on oxygen, and is unable to ambulate. Pt's daughter said that the pt has a physical therapist, an occupational therapist, and a Child psychotherapist coming to his home regularly. Pt states he does not want to move into a assisted nursing facility, he would prefer to continue to live at home. Pt denies light headedness.    Past Medical History  Diagnosis Date  . Hypertension   . High cholesterol   . Hyponatremia syndrome   . Chronic cor pulmonale   . COPD (chronic obstructive pulmonary disease)   . Aortic aneurysm   . Chronic respiratory failure with hypoxia 03/01/2015   Past Surgical History  Procedure Laterality Date  . Angioplasty    . Left heart catheterization with coronary angiogram N/A 10/23/2012    Procedure: LEFT HEART CATHETERIZATION WITH CORONARY ANGIOGRAM;  Surgeon: [name], MD;  Location: [facility] CATH LAB;  Service: Cardiovascular;  Laterality: N/A;  . Abdominal angiogram N/A 10/23/2012    Procedure: ABDOMINAL ANGIOGRAM;  Surgeon: [name], MD;  Location: [facility] CATH LAB;  Service: Cardiovascular;  Laterality: N/A;   History   Social History  . Marital Status: Divorced    Spouse Name: N/A  . Number of Children: N/A  . Years of Education: N/A   Occupational History  . retired    Social History Main Topics  . 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 Smokeless tobacco: Never Used  . Alcohol Use: No  . Drug Use: No    . Sexual Activity: Not on file   Other Topics Concern  . None   Social History Narrative   History reviewed. No pertinent family history. No Known Allergies Prior to Admission medications   Medication Sig Start Date End Date Taking? Authorizing Provider  alum & mag hydroxide-simeth (MAALOX/MYLANTA) 200-200-20 MG/5ML suspension Take 15 mLs by mouth every 6 (six) hours as needed for indigestion or heartburn. 03/17/15  Yes Joseph Art, DO  antiseptic oral rinse (CPC / CETYLPYRIDINIUM CHLORIDE 0.05%) 0.05 % LIQD solution 7 mLs by Mouth Rinse route 2 (two) times daily. 03/17/15  Yes Joseph Art, DO  aspirin EC 81 MG tablet Take 81 mg by mouth daily.   Yes Historical Provider, MD  bisacodyl (DULCOLAX) 10 MG suppository Place 1 suppository (10 mg total) rectally daily as needed for moderate constipation. 03/17/15  Yes Joseph Art, DO  docusate sodium (COLACE) 100 MG capsule Take 1 capsule (100 mg total) by mouth 2 (two) times daily. 03/17/15  Yes Joseph Art, DO  feeding supplement, RESOURCE BREEZE, (RESOURCE BREEZE) LIQD Take 1 Container by mouth 3 (three) times daily between meals. 03/17/15  Yes Joseph Art, DO  HYDROcodone-acetaminophen (NORCO) 5-325 MG per tablet Take 1 tablet by mouth every 6 (six) hours as needed for moderate pain. 03/17/15  Yes Joseph Art, DO  Linaclotide (LINZESS) 145 MCG CAPS capsule Take 1 capsule (145 mcg total) by mouth daily. 04/03/15  Yes Sharon Seller, NP  metoprolol (LOPRESSOR) 50 MG tablet Take 50 mg by mouth 2 (two) times daily.  12/31/14  Yes Historical Provider, MD  ondansetron (ZOFRAN) 4 MG tablet Take 1 tablet (4 mg total) by mouth every 6 (six) hours as needed for nausea. 03/17/15  Yes Jessica U Vann, DO  pantoprazole (PROTONIX) 40 MG tablet Take 1 tablet (40 mg total) by mouth daily. 03/17/15  Yes Joseph Art, DO  polyethylene glycol (MIRALAX / GLYCOLAX) packet Take 17 g by mouth daily. 03/17/15  Yes Joseph Art, DO  QUEtiapine (SEROQUEL)  25 MG tablet Take 25 mg by mouth at bedtime.   Yes Historical Provider, MD  simethicone (MYLICON) 80 MG chewable tablet Chew 1 tablet (80 mg total) by mouth every 6 (six) hours as needed for flatulence. 03/17/15  Yes Joseph Art, DO  nicotine (NICODERM CQ - DOSED IN MG/24 HOURS) 14 mg/24hr patch Place 1 patch (14 mg total) onto the skin daily. Patient not taking: Reported on 04/25/2015 03/17/15   Joseph Art, DO     ROS: The patient denies fevers, chills, night sweats, chest pain, palpitations  All other systems have been reviewed and were otherwise negative with the exception of those mentioned in the HPI and as above.    PHYSICAL EXAM: Filed Vitals:   04/25/15 1427  BP: 82/79  Pulse: 81  Temp: 97.8 F (36.6 C)  Resp: 18   Body mass index is 18.14 kg/(m^2).   General: Somewhat disheveled cooperative gentleman, alert, no acute distress.  HEENT:  Normocephalic, atraumatic, oropharynx patent. Eye: Non[facility] Hoyer [facility] Cardiovascular:  Regular rate no murmur.  Respiratory: Very poor air exchange in the bases. Abdominal: No organomegaly, abdomen is soft and non-tender, positive bowel sounds.  No masses. Musculoskeletal: Seated in a wheelchair, on oxygen, unable to ambulate. Skin: No rashes. Neurologic: Facial musculature symmetric. Psychiatric: Patient acts appropriately throughout our interaction. Lymphatic: No cervical or submandibular lymphadenopathy    LABS: Results for orders placed or performed during the hospital encounter of 03/10/15  Urinalysis, Routine w reflex microscopic  Result Value Ref Range   Color, Urine YELLOW YELLOW   APPearance CLEAR CLEAR   Specific Gravity, Urine 1.017 1.005 - 1.030   pH 6.5 5.0 - 8.0   Glucose, UA NEGATIVE NEGATIVE mg/dL   Hgb urine dipstick NEGATIVE NEGATIVE   Bilirubin Urine NEGATIVE NEGATIVE   Ketones, ur 40 (A) NEGATIVE mg/dL   Protein, ur NEGATIVE NEGATIVE mg/dL   Urobilinogen, UA 1.0 0.0 - 1.0 mg/dL   Nitrite NEGATIVE NEGATIVE    Leukocytes, UA NEGATIVE NEGATIVE  CBC with Differential  Result Value Ref Range   WBC 13.1 (H) 4.0 - 10.5 K/uL   RBC 4.87 4.22 - 5.81 MIL/uL   Hemoglobin 15.6 13.0 - 17.0 g/dL   HCT 16.1 09.6 - 04.5 %   MCV 95.7 78.0 - 100.0 fL   MCH 32.0 26.0 - 34.0 pg   MCHC 33.5 30.0 - 36.0 g/dL   RDW 40.9 81.1 - 91.4 %   Platelets 384 150 - 400 K/uL   Neutrophils Relative % 86 (H) 43 - 77 %   Neutro Abs 11.1 (H) 1.7 - 7.7 K/uL   Lymphocytes Relative 7 (L) 12 - 46 %   Lymphs Abs 0.9 0.7 - 4.0 K/uL   Monocytes Relative 7 3 - 12 %   Monocytes Absolute 1.0 0.1 -  1.0 K/uL   Eosinophils Relative 0 0 - 5 %   Eosinophils Absolute 0.0 0.0 - 0.7 K/uL   Basophils Relative 0 0 - 1 %   Basophils Absolute 0.0 0.0 - 0.1 K/uL  Comprehensive metabolic panel  Result Value Ref Range   Sodium 123 (L) 135 - 145 mmol/L   Potassium 4.5 3.5 - 5.1 mmol/L   Chloride 80 (L) 101 - 111 mmol/L   CO2 31 22 - 32 mmol/L   Glucose, Bld 118 (H) 65 - 99 mg/dL   BUN 11 6 - 20 mg/dL   Creatinine, Ser 1.91 0.61 - 1.24 mg/dL   Calcium 9.0 8.9 - 47.8 mg/dL   Total Protein 6.8 6.5 - 8.1 g/dL   Albumin 3.5 3.5 - 5.0 g/dL   AST 25 15 - 41 U/L   ALT 13 (L) 17 - 63 U/L   Alkaline Phosphatase 75 38 - 126 U/L   Total Bilirubin 1.0 0.3 - 1.2 mg/dL   GFR calc non Af Amer >60 >60 mL/min   GFR calc Af Amer >60 >60 mL/min   Anion gap 12 5 - 15  Lactic acid, plasma  Result Value Ref Range   Lactic Acid, Venous 1.0 0.5 - 2.0 mmol/L  Lactic acid, plasma  Result Value Ref Range   Lactic Acid, Venous 1.0 0.5 - 2.0 mmol/L  Sodium, urine, random  Result Value Ref Range   Sodium, Ur 106 mmol/L  Osmolality, urine  Result Value Ref Range   Osmolality, Ur 503 390 - 1090 mOsm/kg  Basic metabolic panel  Result Value Ref Range   Sodium 125 (L) 135 - 145 mmol/L   Potassium 4.5 3.5 - 5.1 mmol/L   Chloride 86 (L) 101 - 111 mmol/L   CO2 31 22 - 32 mmol/L   Glucose, Bld 85 65 - 99 mg/dL   BUN 8 6 - 20 mg/dL   Creatinine, Ser 2.95 0.61 -  1.24 mg/dL   Calcium 8.4 (L) 8.9 - 10.3 mg/dL   GFR calc non Af Amer >60 >60 mL/min   GFR calc Af Amer >60 >60 mL/min   Anion gap 8 5 - 15  Uric acid  Result Value Ref Range   Uric Acid, Serum 3.6 (L) 4.4 - 7.6 mg/dL  CBC with Differential/Platelet  Result Value Ref Range   WBC 8.2 4.0 - 10.5 K/uL   RBC 4.24 4.22 - 5.81 MIL/uL   Hemoglobin 13.6 13.0 - 17.0 g/dL   HCT 62.1 30.8 - 65.7 %   MCV 96.2 78.0 - 100.0 fL   MCH 32.1 26.0 - 34.0 pg   MCHC 33.3 30.0 - 36.0 g/dL   RDW 84.6 96.2 - 95.2 %   Platelets 318 150 - 400 K/uL   Neutrophils Relative % 80 (H) 43 - 77 %   Neutro Abs 6.5 1.7 - 7.7 K/uL   Lymphocytes Relative 9 (L) 12 - 46 %   Lymphs Abs 0.8 0.7 - 4.0 K/uL   Monocytes Relative 11 3 - 12 %   Monocytes Absolute 0.9 0.1 - 1.0 K/uL   Eosinophils Relative 0 0 - 5 %   Eosinophils Absolute 0.0 0.0 - 0.7 K/uL   Basophils Relative 0 0 - 1 %   Basophils Absolute 0.0 0.0 - 0.1 K/uL  TSH  Result Value Ref Range   TSH 2.212 0.350 - 4.500 uIU/mL  Osmolality  Result Value Ref Range   Osmolality 258 (L) 275 - 300 mOsm/kg  Basic metabolic panel  Result Value Ref Range   Sodium 121 (L) 135 - 145 mmol/L   Potassium 4.2 3.5 - 5.1 mmol/L   Chloride 86 (L) 101 - 111 mmol/L   CO2 29 22 - 32 mmol/L   Glucose, Bld 81 65 - 99 mg/dL   BUN <5 (L) 6 - 20 mg/dL   Creatinine, Ser 4.54 (L) 0.61 - 1.24 mg/dL   Calcium 8.2 (L) 8.9 - 10.3 mg/dL   GFR calc non Af Amer >60 >60 mL/min   GFR calc Af Amer >60 >60 mL/min   Anion gap 6 5 - 15  Basic metabolic panel  Result Value Ref Range   Sodium 121 (L) 135 - 145 mmol/L   Potassium 3.8 3.5 - 5.1 mmol/L   Chloride 84 (L) 101 - 111 mmol/L   CO2 31 22 - 32 mmol/L   Glucose, Bld 79 65 - 99 mg/dL   BUN <5 (L) 6 - 20 mg/dL   Creatinine, Ser 0.98 0.61 - 1.24 mg/dL   Calcium 7.8 (L) 8.9 - 10.3 mg/dL   GFR calc non Af Amer >60 >60 mL/min   GFR calc Af Amer >60 >60 mL/min   Anion gap 6 5 - 15  Basic metabolic panel  Result Value Ref Range    Sodium 124 (L) 135 - 145 mmol/L   Potassium 4.3 3.5 - 5.1 mmol/L   Chloride 89 (L) 101 - 111 mmol/L   CO2 30 22 - 32 mmol/L   Glucose, Bld 94 65 - 99 mg/dL   BUN 7 6 - 20 mg/dL   Creatinine, Ser 1.19 (L) 0.61 - 1.24 mg/dL   Calcium 8.2 (L) 8.9 - 10.3 mg/dL   GFR calc non Af Amer >60 >60 mL/min   GFR calc Af Amer >60 >60 mL/min   Anion gap 5 5 - 15  Basic metabolic panel  Result Value Ref Range   Sodium 123 (L) 135 - 145 mmol/L   Potassium 4.3 3.5 - 5.1 mmol/L   Chloride 86 (L) 101 - 111 mmol/L   CO2 30 22 - 32 mmol/L   Glucose, Bld 95 65 - 99 mg/dL   BUN 6 6 - 20 mg/dL   Creatinine, Ser 1.47 0.61 - 1.24 mg/dL   Calcium 8.0 (L) 8.9 - 10.3 mg/dL   GFR calc non Af Amer >60 >60 mL/min   GFR calc Af Amer >60 >60 mL/min   Anion gap 7 5 - 15  Basic metabolic panel  Result Value Ref Range   Sodium 124 (L) 135 - 145 mmol/L   Potassium 4.0 3.5 - 5.1 mmol/L   Chloride 83 (L) 101 - 111 mmol/L   CO2 33 (H) 22 - 32 mmol/L   Glucose, Bld 94 65 - 99 mg/dL   BUN 6 6 - 20 mg/dL   Creatinine, Ser 8.29 0.61 - 1.24 mg/dL   Calcium 8.3 (L) 8.9 - 10.3 mg/dL   GFR calc non Af Amer >60 >60 mL/min   GFR calc Af Amer >60 >60 mL/min   Anion gap 8 5 - 15  Glucose, capillary  Result Value Ref Range   Glucose-Capillary 98 65 - 99 mg/dL  Glucose, capillary  Result Value Ref Range   Glucose-Capillary 90 65 - 99 mg/dL  Creatinine, serum  Result Value Ref Range   Creatinine, Ser 0.69 0.61 - 1.24 mg/dL   GFR calc non Af Amer >60 >60 mL/min   GFR calc Af Amer >60 >60 mL/min     EKG/XRAY:  [facility] Not done   ASSESSMENT/PLAN: Patient discharged from [facility] 3 weeks ago he had previously been admitted to the hospital with hyponatremia COPD and failure to thrive along with some decline in mental status. He seemed to respond while at the nursing home. He was discharged to continue occupational therapy and physical therapy at home. He is brought here today by his  daughter for follow-up. On initial Evaluation his pulse ox was 80% on O2 with a blood pressure on 82/79. I repeated his blood pressure manually and received a blood pressure of 80 palpable left arm. Because of this the patient was placed on the monitor EMS was called and he will be transported to the hospital for further evaluation. I do not feel this patient is able to live at home. Certainly would hope we can get a disposition where he can be a care facility and get the care he needs   Gross sideeffects, risk and benefits, and alternatives of medications d/w patient. Patient is aware that all medications have potential sideeffects and we are unable to predict every sideeffect or drug-drug interaction that may occur.  Lesle ChrisSteven Marabeth Melland MD 04/25/2015 2:41 PM

## 2015-04-25 NOTE — ED Notes (Signed)
Patient transported to X-ray, O2 remains in place.

## 2015-04-25 NOTE — ED Notes (Signed)
Hr long neb tx completed.  When asked if he feels better, pt answered "I reckon a little"  Remains alert.

## 2015-04-26 ENCOUNTER — Encounter (HOSPITAL_COMMUNITY): Payer: Self-pay | Admitting: Family Medicine

## 2015-04-26 DIAGNOSIS — E871 Hypo-osmolality and hyponatremia: Secondary | ICD-10-CM

## 2015-04-26 DIAGNOSIS — R5381 Other malaise: Secondary | ICD-10-CM

## 2015-04-26 DIAGNOSIS — J449 Chronic obstructive pulmonary disease, unspecified: Secondary | ICD-10-CM | POA: Insufficient documentation

## 2015-04-26 DIAGNOSIS — I959 Hypotension, unspecified: Secondary | ICD-10-CM

## 2015-04-26 DIAGNOSIS — J9620 Acute and chronic respiratory failure, unspecified whether with hypoxia or hypercapnia: Secondary | ICD-10-CM | POA: Insufficient documentation

## 2015-04-26 DIAGNOSIS — E43 Unspecified severe protein-calorie malnutrition: Secondary | ICD-10-CM

## 2015-04-26 MED ORDER — HYDROCODONE-ACETAMINOPHEN 5-325 MG PO TABS
1.0000 | ORAL_TABLET | Freq: Four times a day (QID) | ORAL | Status: DC | PRN
  Administered 2015-04-26 – 2015-04-28 (×4): 1 via ORAL
  Filled 2015-04-26 (×4): qty 1

## 2015-04-26 MED ORDER — SIMETHICONE 80 MG PO CHEW: 80.0000 mg | CHEWABLE_TABLET | Freq: Four times a day (QID) | ORAL | Status: DC | PRN

## 2015-04-26 MED ORDER — ASPIRIN EC 81 MG PO TBEC
81.0000 mg | DELAYED_RELEASE_TABLET | Freq: Every day | ORAL | Status: DC
  Administered 2015-04-26 – 2015-04-28 (×3): 81 mg via ORAL
  Filled 2015-04-26 (×3): qty 1

## 2015-04-26 MED ORDER — BOOST / RESOURCE BREEZE PO LIQD
1.0000 | Freq: Three times a day (TID) | ORAL | Status: DC
  Administered 2015-04-27: 1 via ORAL

## 2015-04-26 MED ORDER — CETYLPYRIDINIUM CHLORIDE 0.05 % MT LIQD
7.0000 mL | Freq: Two times a day (BID) | OROMUCOSAL | Status: DC
  Administered 2015-04-26 – 2015-04-27 (×4): 7 mL via OROMUCOSAL

## 2015-04-26 MED ORDER — SENNA 8.6 MG PO TABS
1.0000 | ORAL_TABLET | Freq: Every day | ORAL | Status: DC | PRN
  Administered 2015-04-26 – 2015-04-27 (×2): 8.6 mg via ORAL
  Filled 2015-04-26 (×2): qty 1

## 2015-04-26 MED ORDER — IPRATROPIUM-ALBUTEROL 0.5-2.5 (3) MG/3ML IN SOLN
3.0000 mL | Freq: Three times a day (TID) | RESPIRATORY_TRACT | Status: DC
  Administered 2015-04-26 – 2015-04-28 (×6): 3 mL via RESPIRATORY_TRACT
  Filled 2015-04-26 (×7): qty 3

## 2015-04-26 MED ORDER — PANTOPRAZOLE SODIUM 40 MG PO TBEC
40.0000 mg | DELAYED_RELEASE_TABLET | Freq: Every day | ORAL | Status: DC
  Administered 2015-04-26 – 2015-04-28 (×3): 40 mg via ORAL
  Filled 2015-04-26 (×3): qty 1

## 2015-04-26 MED ORDER — PREDNISONE 20 MG PO TABS
40.0000 mg | ORAL_TABLET | Freq: Every day | ORAL | Status: DC
  Administered 2015-04-26 – 2015-04-28 (×3): 40 mg via ORAL
  Filled 2015-04-26 (×2): qty 2

## 2015-04-26 MED ORDER — ENOXAPARIN SODIUM 40 MG/0.4ML ~~LOC~~ SOLN
40.0000 mg | SUBCUTANEOUS | Status: DC
  Administered 2015-04-26 – 2015-04-27 (×2): 40 mg via SUBCUTANEOUS
  Filled 2015-04-26 (×2): qty 0.4

## 2015-04-26 MED ORDER — BISACODYL 10 MG RE SUPP
10.0000 mg | Freq: Every day | RECTAL | Status: DC | PRN
  Administered 2015-04-27: 10 mg via RECTAL
  Filled 2015-04-26 (×2): qty 1

## 2015-04-26 NOTE — Progress Notes (Signed)
CSW spoke briefly with pt re: d/c planning.  Pt doesn't want to go to NH, but will if he has to.  Pt recently used his covered 20 Medicare days and has no supplement/Medicaid.  Per pt, [place] [facility] [place].  Pt directed CSW to speak with his daughter, Colton Owens re: placement.  CSW will f/u with daughter.

## 2015-04-26 NOTE — Progress Notes (Signed)
Family Medicine Teaching Service Daily Progress Note Intern Pager: [phone]  Patient name: Colton Owens Medical record number: [id number] Date of birth: 1941/07/18 Age: 74 y.o. Gender: male  Primary Care Provider: Lucilla EdinAUB, STEVE A, MD Consultants: none Code Status: full  Pt Overview and Major Events to Date:  07/09: Admit to telemetry  Assessment and Plan: Acute on chronic respiratory failure due to COPD: Worsened hypoxemia (3.5L O2 requirement) on poor baseline pulmonary status (2L home-dependence). Also chronically hypercapneic (incompletely compensated respiratory acidosis on ABG). Continues to smoke (probable trigger). CXR without evidence of PNA or volume overload, BNP normal. Troponin negative, ECG stable and nonischemic. Doubt PE.  - Admit to FMTS, Dr. Gwendolyn GrantWalden, on telemetry for observation - Duonebs q6h / albuterol neb q3h prn - Oxygen by nasal cannula prn hypoxemia - Given solumedrol x1, complete 5 day course with prednisone 40mg  daily x 4 days - Low threshold (AMS, arrhythmia, respiratory distress) for ABG - Per COPD gold protocol, consults to care management and social work have been placed - Tobacco cessation counseling provided, MAR states nicotine patch 14mg  but pt requests 21mg  which has been ordered.   Failure to thrive: Poor functional status worsening while living at home following 21-day SNF admission.  - PT eval and treat  - CSW consulted as dispo to NH is likely safest option. Pt's daughter and son-in-law agree.   HTN/cor pulmonale: Hold home anti-hypertensives (metoprolol) given tenuous BPs in ED.  - Continue ASA 81mg   Chronic hyponatremia: Na 132, stable/above normal baseline. - Monitor  AAA: Stable 37mm with mural thrombus on CT Abd 03/11/2015.  - No intervention planned.  Constipation: Pt amenable to stool softeners but no laxatives.  - Colace BID  FEN/GI: Saline lock, regular diet Prophylaxis: Lovenox  Disposition: telemetry  Subjective:  Colton Emarl W  Owens is a 74 y.o. male presenting with shortness of breath.  He was seen this morning and is sitting up in bed eating breakfast. He is in NAD and his shortness of breath has improved from last night. He is now on 2L  O2 at has 90% O2 saturation. His hypotension has also improved to 100/58. He is still coughing but it has been improving. Discussed SNF placement with patient and he does not want to go. Discussed benefits of going to SNF with patient.  Objective: Temp:  [97.8 F (36.6 C)-98.6 F (37 C)] 98.6 F (37 C) (07/10 0511) Pulse Rate:  [78-105] 86 (07/10 0511) Resp:  [16-25] 18 (07/10 0511) BP: (82-127)/(58-85) 100/58 mmHg (07/10 0511) SpO2:  [80 %-99 %] 90 % (07/10 0753) Weight:  [130 lb (58.968 kg)-134 lb (60.782 kg)] 134 lb (60.782 kg) (07/09 1555) Physical Exam: General: In NAD, alert and oriented x3. Thin body habitus. Sitting up in bed eating breakfast Cardiovascular: Regular rate, no murmur, no JVD, 3+ pitting edema to upper ankles bilaterally Respiratory: No increased WOB, decreased breath sounds, no wheezing, rhonchi, or rales appreciated. On 2L O2  Abdomen: positive bowel sounds, soft, nondistended, nontender Extremities: +3 pitting edema in bilateral ankles  Laboratory:  Recent Labs Lab 04/25/15 1624  WBC 7.7  HGB 11.3*  HCT 36.8*  PLT 312    Recent Labs Lab 04/25/15 1624  NA 132*  K 4.6  CL 90*  CO2 38*  BUN 19  CREATININE 0.75  CALCIUM 8.3*  PROT 5.9*  BILITOT 0.5  ALKPHOS 95  ALT 15*  AST 21  GLUCOSE 101*     Imaging/Diagnostic Tests: Dg Chest 2 View  04/25/2015  CLINICAL DATA:  Shortness of breath.  EXAM: CHEST  2 VIEW  COMPARISON:  Radiograph of same day.  CT scan of Feb 28, 2015.  FINDINGS: The heart size and mediastinal contours are within normal limits. No pneumothorax or pleural effusion is noted. Hyperexpansion of the lungs is noted suggesting chronic obstructive pulmonary disease. No acute pulmonary disease is noted. Opacity seen  over right upper lobe on prior exam is no longer present, and presumably represented external artifact. There is noted severe compression deformity of mid thoracic vertebral body which was not present on prior CT scan, and is concerning for acute compression fracture.  IMPRESSION: Findings consistent with chronic obstructive pulmonary disease. No acute pulmonary disease is noted.  Interval development of severe compression deformity of mid thoracic vertebral body concerning for acute compression fracture.   Electronically Signed   By: Lupita Raider, M.D.   On: 04/25/2015 18:16   Dg Chest Port 1 View  04/25/2015   CLINICAL DATA:  Shortness of breath.  EXAM: PORTABLE CHEST - 1 VIEW  COMPARISON:  Mar 11, 2015.  FINDINGS: The heart size and mediastinal contours are within normal limits. No pneumothorax or pleural effusion is noted. Bony thorax appears intact. Left lung is clear. Mild right upper lobe opacity is noted with overlying external object ; underlying pneumonia cannot be excluded. The visualized skeletal structures are unremarkable.  IMPRESSION: Mild right upper lobe opacity is noted, but there is an overlying external object which may be creating this as artifact. However, focal pneumonia cannot be excluded and repeat radiograph without overlying external objects is recommended.   Electronically Signed   By: Lupita Raider, M.D.   On: 04/25/2015 17:17     Beaulah Dinning, MD 04/26/2015, 10:48 AM PGY-1, [facility] FPTS Intern pager: [phone], text pages welcome

## 2015-04-26 NOTE — Evaluation (Addendum)
Physical Therapy Evaluation Patient Details Name: Colton Owens MRN: [id number] DOB: April 02, 1941 Today's Date: 04/26/2015   History of Present Illness  74 y.o. male presenting with shortness of breath . PMH is significant for COPD, HTN, HLD and AAA.   Clinical Impression  Pt presents with generalized deconditioning and debility. Pt required supervision for ambulation of 75'; limited by SOB and fatigue. Pt to benefit from skilled PT to address deficits and maximize functional mobility. Has 4 STE mobile home upon D/C. Pt will need to be mod I to D/c home alone. Recommend SNF at this time due to poor activity tolerance and lack of (A) at home. If pt D/C home, will need HHPT.     Follow Up Recommendations Supervision/Assistance - 24 hour;SNF    Equipment Recommendations  None recommended by PT    Recommendations for Other Services       Precautions / Restrictions Precautions Precautions: Other (comment) Precaution Comments: O2 sats Restrictions Weight Bearing Restrictions: No      Mobility  Bed Mobility Overal bed mobility: Independent                Transfers Overall transfer level: Modified independent Equipment used: Rolling walker (2 wheeled)             General transfer comment: using RW for balance  Ambulation/Gait Ambulation/Gait assistance: Supervision Ambulation Distance (Feet): 75 Feet Assistive device: Rolling walker (2 wheeled) Gait Pattern/deviations: WFL(Within Functional Limits) Gait velocity: decr Gait velocity interpretation: Below normal speed for age/gender General Gait Details: using RW due to generalized debility; unsteady gait; no LOB   Stairs            Wheelchair Mobility    Modified Rankin (Stroke Patients Only)       Balance Overall balance assessment: Needs assistance Sitting-balance support: Feet supported Sitting balance-Leahy Scale: Good     Standing balance support: Bilateral upper extremity supported Standing  balance-Leahy Scale: Fair                               Pertinent Vitals/Pain Pain Assessment: No/denies pain    Home Living Family/patient expects to be discharged to:: Private residence Living Arrangements: Alone Available Help at Discharge: Neighbor;Family;Available PRN/intermittently Type of Home: Mobile home Home Access: Stairs to enter Entrance Stairs-Rails: Left Entrance Stairs-Number of Steps: 4 Home Layout: One level Home Equipment: None Additional Comments: Pt reports he was at [facility] for 3 weeks and d/c'd home on 6/21.  He reports he has been struggling with the energy to perform meal prep, and ADLs. He was scheduled to start receiving meals on wheels 7/11 per his report,   He was receiving HH    Prior Function Level of Independence: Needs assistance   Gait / Transfers Assistance Needed: mod I  ADL's / Homemaking Assistance Needed: Pt reports he was sponge bathing, but struggling due to lack of energy. His daughter and neighbors buy groceries.  He was struggling to perform meal prep.          Hand Dominance   Dominant Hand: Right    Extremity/Trunk Assessment   Upper Extremity Assessment: Generalized weakness           Lower Extremity Assessment: Generalized weakness      Cervical / Trunk Assessment: Normal  Communication   Communication: No difficulties  Cognition Arousal/Alertness: Awake/alert Behavior During Therapy: WFL for tasks assessed/performed Overall Cognitive Status: Within Functional Limits for tasks assessed  General Comments General comments (skin integrity, edema, etc.): Pt reports he wishes to stay at home, but acknowledges he is struggling.  He reports he is in process of applying for VA benefits to see if they can provide an aid to assist him with ADLs and meal prep.  Discussed option of daugher, or other family, preparing meals that can be individually frozen and then microwaved      Exercises        Assessment/Plan    PT Assessment Patient needs continued PT services  PT Diagnosis Difficulty walking;Generalized weakness   PT Problem List Decreased strength;Decreased range of motion;Decreased activity tolerance;Decreased balance;Decreased mobility;Decreased knowledge of use of DME  PT Treatment Interventions DME instruction;Gait training;Stair training;Functional mobility training;Therapeutic activities;Therapeutic exercise;Balance training;Neuromuscular re-education;Patient/family education   PT Goals (Current goals can be found in the Care Plan section) Acute Rehab PT Goals Patient Stated Goal: to go home  PT Goal Formulation: With patient Time For Goal Achievement: 05/10/15 Potential to Achieve Goals: Good    Frequency Min 3X/week   Barriers to discharge Decreased caregiver support lives alone    Co-evaluation               End of Session Equipment Utilized During Treatment: Gait belt;Oxygen Activity Tolerance: Patient limited by fatigue Patient left: in bed;with call bell/phone within reach Nurse Communication: Mobility status         Time: 0920-0948 PT Time Calculation (min) (ACUTE ONLY): 28 min   Charges:   PT Evaluation $Initial PT Evaluation Tier I: 1 Procedure PT Treatments $Gait Training: 8-22 mins   PT G Codes:        Schelly Chuba, [place] N PT  04/26/2015, 12:18 PM

## 2015-04-26 NOTE — Evaluation (Signed)
Occupational Therapy Evaluation Patient Details Name: Colton Owens MRN: [id number] DOB: Jan 27, 1941 Today's Date: 04/26/2015    History of Present Illness 74 y.o. male presenting with shortness of breath . PMH is significant for COPD, HTN, HLD and AAA.    Clinical Impression   Pt admitted with above. He demonstrates the below listed deficits and will benefit from continued OT to maximize safety and independence with BADLs.  Pt presents to OT with deconditioning.  He reports he as at [facility]) until 6/21, and has been struggling at home since that time.  He reports he has difficulty with meal prep and often skips meals.  He also reports he is also unable to complete his bath in the a.m. due to fatigue.  He is signe up with Meals on Wheels, and reports he is trying to get assist from the [place] for a PCA.   Currently, pt needs 24 hour assist at discharge, and if that is not available, then optimally recommend SNF level rehab, but unsure if he will agree to this.       Follow Up Recommendations  SNF;Supervision/Assistance - 24 hour    Equipment Recommendations  3 in 1 bedside comode;Tub/shower bench    Recommendations for Other Services       Precautions / Restrictions Precautions Precautions: Other (comment) Precaution Comments: O2 sats Restrictions Weight Bearing Restrictions: No      Mobility Bed Mobility Overal bed mobility: Independent                Transfers Overall transfer level: Modified independent Equipment used: Rolling walker (2 wheeled)             General transfer comment: using RW for balance    Balance Overall balance assessment: Needs assistance Sitting-balance support: Feet supported Sitting balance-Leahy Scale: Good     Standing balance support: Bilateral upper extremity supported Standing balance-Leahy Scale: Fair                              ADL Overall ADL's : Needs assistance/impaired Eating/Feeding:  Independent;Bed level;Sitting   Grooming: Wash/dry hands;Wash/dry face;Oral care;Supervision/safety;Standing   Upper Body Bathing: Set up;Sitting   Lower Body Bathing: Moderate assistance;Sit to/from stand   Upper Body Dressing : Set up;Minimal assistance;Sitting   Lower Body Dressing: Minimal assistance;Sit to/from stand   Toilet Transfer: Supervision/safety;Ambulation;Comfort height toilet;RW   Toileting- Clothing Manipulation and Hygiene: Supervision/safety;Sit to/from stand       Functional mobility during ADLs: Supervision/safety;Rolling walker General ADL Comments: Pt fatigues easliy.  He is able to complete all components of his ADLs in isolation with supervision/set up, but fatigues and is unable to complete a full am ADL of bathing, dressing, grooming without assist.   He reports he has been struggling with this at home      Vision     Perception     Praxis      Pertinent Vitals/Pain Pain Assessment: No/denies pain     Hand Dominance Right   Extremity/Trunk Assessment Upper Extremity Assessment Upper Extremity Assessment: Generalized weakness   Lower Extremity Assessment Lower Extremity Assessment: Generalized weakness   Cervical / Trunk Assessment Cervical / Trunk Assessment: Normal   Communication Communication Communication: No difficulties   Cognition Arousal/Alertness: Awake/alert Behavior During Therapy: WFL for tasks assessed/performed Overall Cognitive Status: Within Functional Limits for tasks assessed  General Comments       Exercises       Shoulder Instructions      Home Living Family/patient expects to be discharged to:: Private residence Living Arrangements: Alone Available Help at Discharge: Neighbor;Family;Available PRN/intermittently Type of Home: Mobile home Home Access: Stairs to enter Entrance Stairs-Number of Steps: 4 Entrance Stairs-Rails: Left Home Layout: One level     Bathroom  Shower/Tub: Tub/shower unit Shower/tub characteristics: Engineer, building servicesCurtain Bathroom Toilet: Standard     Home Equipment: None   Additional Comments: Pt reports he was at [facility] for [facility] and d/c'd home on 6/21.  He reports he has been struggling with the energy to perform meal prep, and ADLs. He was scheduled to start receiving meals on wheels 7/11 per his report,   He was receiving HH      Prior Functioning/Environment Level of Independence: Needs assistance  Gait / Transfers Assistance Needed: mod I ADL's / Homemaking Assistance Needed: Pt reports he was sponge bathing, but struggling due to lack of energy. His daughter and neighbors buy groceries.  He was struggling to perform meal prep.          OT Diagnosis: Generalized weakness   OT Problem List: Decreased strength;Decreased activity tolerance;Cardiopulmonary status limiting activity   OT Treatment/Interventions: Self-care/ADL training;Therapeutic exercise;DME and/or AE instruction;Therapeutic activities;Patient/family education    OT Goals(Current goals can be found in the care plan section) Acute Rehab OT Goals Patient Stated Goal: to go home  OT Goal Formulation: With patient Time For Goal Achievement: 05/10/15 Potential to Achieve Goals: Fair ADL Goals Pt Will Perform Lower Body Bathing: with supervision;with adaptive equipment;sit to/from stand Pt Will Perform Lower Body Dressing: with supervision;with adaptive equipment;sit to/from stand Additional ADL Goal #1: pt will be independent with energy conservation techniques  Additional ADL Goal #2: Pt will be independent with HEP for bil. UE strengthening   OT Frequency: Min 2X/week   Barriers to D/C: Decreased caregiver support          Co-evaluation              End of Session Equipment Utilized During Treatment: Rolling walker Nurse Communication: Mobility status  Activity Tolerance: Patient limited by fatigue Patient left: in bed;with call bell/phone  within reach   Time: 1134-1230 OT Time Calculation (min): 56 min Charges:  OT General Charges $OT Visit: 1 Procedure OT Evaluation $Initial OT Evaluation Tier I: 1 Procedure OT Treatments $Self Care/Home Management : 23-37 mins G-Codes:    Teneshia Hedeen M 04/26/2015, 12:31 PM

## 2015-04-27 DIAGNOSIS — I10 Essential (primary) hypertension: Secondary | ICD-10-CM

## 2015-04-27 DIAGNOSIS — R0602 Shortness of breath: Secondary | ICD-10-CM

## 2015-04-27 DIAGNOSIS — J9611 Chronic respiratory failure with hypoxia: Secondary | ICD-10-CM

## 2015-04-27 MED ORDER — BOOST PLUS PO LIQD
237.0000 mL | Freq: Three times a day (TID) | ORAL | Status: DC
  Administered 2015-04-27 – 2015-04-28 (×2): 237 mL via ORAL
  Filled 2015-04-27 (×8): qty 237

## 2015-04-27 MED ORDER — ADULT MULTIVITAMIN W/MINERALS CH
1.0000 | ORAL_TABLET | Freq: Every day | ORAL | Status: DC
  Administered 2015-04-28: 1 via ORAL
  Filled 2015-04-27: qty 1

## 2015-04-27 NOTE — Clinical Documentation Improvement (Signed)
MD's, NP's, and PA's   Noted documentation of "acute respiratory failure" due to "COPD" , please document the acuity of this condition causing admission.  Thank you    Possible Clinical Conditions?  Please specify type of COPD: ? Acute bronchitis with COPD ? Asthma with COPD ? With acute exacerbation ? Bronchiectasis ? Chronic obstructive bronchitis ? Exacerbation of COPD ? Emphysema ? Other COPD (please specify) ? Unable to determine ? Unknown  Risk Factors: Acute respiratory failure, COPD,   Sign & Symptoms: 80% on RA,  Diagnostics:Arterial blood gas Treatment:  O2 increased from 2L to 3.5 liters Treatment:  Thank you, Lavonda JumboLawanda J Devon Kingdon ,RN Clinical Documentation Specialist:  [phone]  [facility]- Health Information Management

## 2015-04-27 NOTE — Clinical Social Work Note (Signed)
CSW spoke with patient's daughter [name] to discuss SNF placement.  Patient stated that he was [facility] in the past and he was okay with returning if he needs to.  CSW spoke with patient's daughter as well who was okay with him returning [facility].  CSW will fax information to other SNFs to give patient and his daughter other options.  CSW will continue to follow patient throughout discharge planning.    Ervin KnackEric R. Amman Bartel, MSW, Theresia MajorsLCSWA [phone] 04/27/2015 12:55 PM

## 2015-04-27 NOTE — Progress Notes (Signed)
Utilization Review Completed.Colton Owens T7/08/2015  

## 2015-04-27 NOTE — Clinical Social Work Note (Signed)
Clinical Social Work Assessment  Patient Details  Name: Colton Owens MRN: [id number] Date of Birth: 12/27/1940  Date of referral:  04/27/15               Reason for consult:  Facility Placement                Permission sought to share information with:  Family Supports Permission granted to share information::  Yes, Verbal Permission Granted  Name::     Colton Owens  Agency::  SNF admissions  Relationship::     Contact Information:     Housing/Transportation Living arrangements for the past 2 months:  Single Family Home Source of Information:  Patient, Adult Children Patient Interpreter Needed:  None Criminal Activity/Legal Involvement Pertinent to Current Situation/Hospitalization:  No - Comment as needed Significant Relationships:  None Lives with:  Self Do you feel safe going back to the place where you live?  No Need for family participation in patient care:  Yes (Comment) (Patient requests that his daughter be involved in decision making.)  Care giving concerns:  Patient and his family are concerned that he needs some strengthening in order to return back home.   Social Worker assessment / plan:  Patient is a 74 year old male who lives alone.  Patient is alert and oriented x4.  Patient expressed that he understands that he needs to go to rehab again to increase his strength.  Patient's family agree that patient needs to return to SNF for short term rehab again in order to get his strength back up so he can continue to live on his own.  Patient states he would agree to go back to [facility] and his daughter agreed as well.  Patient plans to go to SNF for short term rehab once he is medically ready and discharge orders have been received.  Employment status:  Retired Health and safety inspectornsurance information:  Armed forces operational officerMedicare, [place] Recommendations:  Skilled Nursing Facility Information / Referral to community resources:  Skilled Nursing Facility  Patient/Family's Response to care:   Patient and family feel that patient would benefit from some short term rehab, and then possibilly may consider [facility]  Patient/Family's Understanding of and Emotional Response to Diagnosis, Current Treatment, and Prognosis:  Patient and family are understanding of the diagnosis, treatement, and prognosis.  Emotional Assessment Appearance:  Appears stated age Attitude/Demeanor/Rapport:    Affect (typically observed):  Appropriate, Blunt, Calm, Stable Orientation:  Oriented to Self, Oriented to Place, Oriented to  Time, Oriented to Situation Alcohol / Substance use:  Tobacco Use Psych involvement (Current and /or in the community):  No (Comment)  Discharge Needs  Concerns to be addressed:  Lack of Support Readmission within the last 30 days:  Yes (March 28, 2015 discharged [facility]) Current discharge risk:  Lives alone Barriers to Discharge:  No Barriers Identified   [place] [employer], Jerald Hennington R, LCSWA 04/27/2015, 2:37 PM

## 2015-04-27 NOTE — Progress Notes (Signed)
Occupational Therapy Treatment Patient Details Name: Lura Emarl W Corales MRN: [id number] DOB: 06-10-1941 Today's Date: 04/27/2015    History of present illness 74 y.o. male presenting with shortness of breath . PMH is significant for COPD, HTN, HLD and AAA.    OT comments  Pt. Making gains with skilled OT.  Continue to educate and reinforce energy conservation into his functional tasks/ADLS.  Recommend SNF if he can not get the A he needs at home esp. Since daily nutrition has been an issue also.    Follow Up Recommendations  SNF;Supervision/Assistance - 24 hour    Equipment Recommendations  3 in 1 bedside comode;Tub/shower bench    Recommendations for Other Services      Precautions / Restrictions Precautions Precaution Comments: O2 sats       Mobility Bed Mobility               General bed mobility comments: eob upon arrival, states he likes to sit on the eob vs recliner  Transfers Overall transfer level: Needs assistance Equipment used: Rolling walker (2 wheeled) Transfers: Sit to/from Stand Sit to Stand: Supervision         General transfer comment: using RW for balance    Balance                                   ADL Overall ADL's : Needs assistance/impaired Eating/Feeding: Set up;Sitting   Grooming: Wash/dry hands;Wash/dry face;Supervision/safety;Standing                   Toilet Transfer: Supervision/safety;Ambulation;Comfort height toilet;RW StatisticianToilet Transfer Details (indicate cue type and reason): simulated during amb. to/from b.room, pt. declined need for "actual" use reporting he had completed prior to arrival Toileting- ArchitectClothing Manipulation and Hygiene: Supervision/safety;Sit to/from stand       Functional mobility during ADLs: Supervision/safety;Rolling walker General ADL Comments: educated and reinforced energy conservation and safety tech. during functional tasks      Vision                     Perception      Praxis      Cognition   Behavior During Therapy: [facility]FL for tasks assessed/performed Overall Cognitive Status: Within Functional Limits for tasks assessed                       Extremity/Trunk Assessment               Exercises     Shoulder Instructions       General Comments      Pertinent Vitals/ Pain       Pain Assessment: No/denies pain  Home Living                                          Prior Functioning/Environment              Frequency Min 2X/week     Progress Toward Goals  OT Goals(current goals can now be found in the care plan section)  Progress towards OT goals: Progressing toward goals     Plan Discharge plan remains appropriate    Co-evaluation                 End of Session Equipment Utilized During Treatment: Gait belt;Rolling walker  Activity Tolerance Patient tolerated treatment well   Patient Left in bed;with call bell/phone within reach   Nurse Communication          Time: 0812-0823 OT Time Calculation (min): 11 min  Charges: OT General Charges $OT Visit: 1 Procedure OT Treatments $Self Care/Home Management : 8-22 mins  Robet Leu, COTA/L 04/27/2015, 8:34 AM

## 2015-04-27 NOTE — Care Management (Signed)
Important Message  Patient Details  Name: Colton Owens MRN: [id number] Date of Birth: 06/13/1941   Medicare Important Message Given:  Yes-second notification given    Orson AloeMegan P Barnabas Henriques 04/27/2015, 3:52 PM

## 2015-04-27 NOTE — Progress Notes (Signed)
Initial Nutrition Assessment  DOCUMENTATION CODES:  Severe malnutrition in context of social or environmental circumstances  INTERVENTION:  MVI (Boost Plus TID)  NUTRITION DIAGNOSIS:  Malnutrition related to social / environmental circumstances as evidenced by severe depletion of muscle mass, severe depletion of body fat.  GOAL:  Patient will meet greater than or equal to 90% of their needs  MONITOR:  PO intake, Supplement acceptance, Labs, Weight trends, Skin, I & O's  REASON FOR ASSESSMENT:  Consult Assessment of nutrition requirement/status  ASSESSMENT: 74 year old male with a recent stay at skilled nursing facility for COPD and failure to thrive who presents with several days increasing of shortness of breath. Past medical history significant for COPD, hypertension, AAA, hyperlipidemia. Patient presented to primary care provider yesterday was found to the hypotensive to systolics of 80s. Also found to be hypoxic. EMS was called and he was transferred to the hospital. Hypoxic and hypotensive in the ER. At PTS was called to admit. Of note patient stated he had started to smoke again at home. Does not use any inhalers at home. He lives at home alone. He himself declines returning to an assisted living or skilled nursing facility.  Pt admitted with acute on chronic respiratory failure due to COPD.   Pt biggest complaint is having no BM for 4-5 days PTA. He reports he has been given a stool softener and laxative since being admitted; this has been an ongoing problem for the past 2-3 months.  Pt reports a general decline in health since being discharged from [facility] approximately 2-3 weeks ago. He reports feeling weak while be at home alone. He endorses good appetite, however, PO intake has been limited, as he has been unable to prepare foods for himself. Noted 100% meal completion; pt reports he consumed all of his breakfast this AM. He denies any problems chewing and  swallowing foods.  Pt reports UBW of 160# and endorses wt loss, however, unable to provide time frame from which weight loss occurred.   Nutrition-Focused physical exam completed. Findings are severe fat depletion, severe muscle depletion, and moderate edema.   Discussed importance of good meal and supplement intake to promote healing. Pt reports he does not like the Boost Breeze supplement ordered and requests chocolate Boost- RD will revise order.  CSW following. Potential to d/c to SNF.  Labs reviewed: Na: 132 (on IV replacement).   Height:  Ht Readings from Last 1 Encounters:  04/25/15  (1.803 m)    Weight:  Wt Readings from Last 1 Encounters:  04/25/15 134 lb (60.782 kg)    Ideal Body Weight:  78.2 kg  Wt Readings from Last 10 Encounters:  04/25/15 134 lb (60.782 kg)  04/25/15 130 lb (58.968 kg)  03/11/15 132 lb 9.6 oz (60.147 kg)  02/28/15 134 lb 14.7 oz (61.2 kg)  03/01/13 152 lb 14.4 oz (69.355 kg)  10/23/12 160 lb (72.576 kg)  08/20/12 159 lb 12.8 oz (72.485 kg)    BMI:  Body mass index is 18.7 kg/(m^2).  Estimated Nutritional Needs:  Kcal:  1800-2000  Protein:  90-100 grams  Fluid:  1.8-2.0 L  Skin:  Reviewed, no issues  Diet Order:  Diet regular Room service appropriate?: Yes; Fluid consistency:: Thin  EDUCATION NEEDS:  Education needs addressed   Intake/Output Summary (Last 24 hours) at 04/27/15 1401 Last data filed at 04/27/15 1147  Gross per 24 hour  Intake   1349 ml  Output   1275 ml  Net  74 ml    Last BM:  04/25/15  Sonal Dorwart A. Mayford KnifeWilliams, RD, LDN, CDE Pager: [phone] After hours Pager: [phone]

## 2015-04-27 NOTE — Progress Notes (Signed)
Family Medicine Teaching Service Daily Progress Note Intern Pager: [phone]  Patient name: Colton Owens Medical record number: [id number] Date of birth: 1941/09/01 Age: 74 y.o. Gender: male  Primary Care Provider: Lucilla EdinAUB, STEVE A, MD Consultants: none Code Status: full  Pt Overview and Major Events to Date:  07/09: Admit to telemetry 07/10: 2L [place]  07/11: 2.5L [place]. PTOT recommends SNF  Assessment and Plan:  Acute on chronic respiratory failure due to COPD: Worsened hypoxemia (3.5L O2 requirement) on poor baseline pulmonary status (2L home-dependence). Also chronically hypercapneic (incompletely compensated respiratory acidosis on ABG). Continues to smoke (probable trigger). CXR without evidence of PNA or volume overload, BNP normal. Troponin negative, ECG stable and nonischemic. Doubt PE.  - Admit to FMTS, Dr. Gwendolyn GrantWalden, on telemetry for observation - Duonebs q6h / albuterol neb q3h prn - Oxygen by nasal cannula prn hypoxemia - Given solumedrol x1, complete 5 day course with prednisone 40mg  daily x 4 days - Low threshold (AMS, arrhythmia, respiratory distress) for ABG - Per COPD gold protocol, consults to care management and social work have been placed - Tobacco cessation counseling provided, MAR states nicotine patch 14mg  but pt requests 21mg  which has been ordered.  -Continue 2.5L [place] wih 98% O2  Failure to thrive: Poor functional status worsening while living at home following 21-day SNF admission.  - PT eval and treat  - CSW consulted as dispo to NH is likely safest option. Pt's daughter and son-in-law agree.  -PTOT recommended SNF  HTN/cor pulmonale: Hold home anti-hypertensives (metoprolol) given tenuous BPs in ED.  - Continue ASA 81mg   Chronic hyponatremia: Na 132, stable/above normal baseline. - Monitor  AAA: Stable 37mm with mural thrombus on CT Abd 03/11/2015.  - No intervention planned.  Constipation: Pt amenable to stool softeners but no laxatives.  - Colace  BID -Added Senokot on 7/10  FEN/GI: Saline lock, regular diet Prophylaxis: Lovenox  Disposition: telemetry Subjective:  Patient is sitting up in bed this morning awaiting his breathing treatment. He is in no acute distress. No acute events overnight and vital signs have remained stable. He did request Senokot yesterday and he had a bowel movement. He has been having a bowel movement daily. He is breathing with mild increased respiratory effort and on 2.5 L [place]. PTOT saw him his morning and recommended SNF. Likely discharge today to SNF. Objective: Temp:  [97.9 F (36.6 C)-98.2 F (36.8 C)] 97.9 F (36.6 C) (07/11 0550) Pulse Rate:  [84-101] 84 (07/11 0550) Resp:  [18-20] 18 (07/11 0550) BP: (105-128)/(60-76) 116/76 mmHg (07/11 0550) SpO2:  [90 %-100 %] 98 % (07/11 0550) Physical Exam: General: In NAD, alert and oriented x3. Thin body habitus. Sitting up in bed awaiting his breathing treatment Cardiovascular: Regular rate, no murmur, no JVD, 3+ pitting edema to upper ankles bilaterally Respiratory: Mildly increased WOB, decreased breath sounds, no wheezing, rhonchi, or rales appreciated. On 2.5L O2  Abdomen: positive bowel sounds, soft, nondistended, nontender Extremities: +2 pitting edema in bilateral ankles  Laboratory:  Recent Labs Lab 04/25/15 1624  WBC 7.7  HGB 11.3*  HCT 36.8*  PLT 312    Recent Labs Lab 04/25/15 1624  NA 132*  K 4.6  CL 90*  CO2 38*  BUN 19  CREATININE 0.75  CALCIUM 8.3*  PROT 5.9*  BILITOT 0.5  ALKPHOS 95  ALT 15*  AST 21  GLUCOSE 101*     Imaging/Diagnostic Tests: No information on file.   Beaulah Dinninghristina M Gambino, MD 04/27/2015, 7:31 AM PGY-1, [facility] Intern pager: [phone], text pages welcome

## 2015-04-27 NOTE — Clinical Social Work Placement (Addendum)
   CLINICAL SOCIAL WORK PLACEMENT  NOTE  Date:  04/27/2015  Patient Details  Name: Colton Owens MRN: [id number] Date of Birth: [birth year]  Clinical Social Work is seeking post-discharge placement for this patient at the Skilled  Nursing Facility level of care (*CSW will initial, date and re-position this form in  chart as items are completed):  Yes   Patient/family provided with [name] Clinical Social Work Department's list of facilities offering this level of care within the geographic area requested by the patient (or if unable, by the patient's family).  Yes   Patient/family informed of their freedom to choose among providers that offer the needed level of care, that participate in Medicare, Medicaid or managed care program needed by the patient, have an available bed and are willing to accept the patient.  Yes   Patient/family informed of [name]'s ownership interest in [name] and [name], as well as of the fact that they are under no obligation to receive care at these facilities.  PASRR submitted to EDS on 04/27/15     PASRR number received on       Existing PASRR number confirmed on 04/27/15     FL2 transmitted to all facilities in geographic area requested by pt/family on 04/27/15     FL2 transmitted to all facilities within larger geographic area on       Patient informed that his/her managed care company has contracts with or will negotiate with certain facilities, including the following:        Yes   Patient/family informed of bed offers received.  [name]     Physician recommends and patient chooses bed at      [name] on  04/[facility]/15.  Patient to be transferred to facility by  [facility]     Patient family notified on  04/28/15 of transfer.  Name of family member notified:   [name], patient's daughter    PHYSICIAN      Additional Comment:     _______________________________________________ Ervin KnackEric R. Tamarius Rosenfield, MSW, Theresia MajorsLCSWA [phone] 04/28/2015 3:16 PM

## 2015-04-28 DIAGNOSIS — E222 Syndrome of inappropriate secretion of antidiuretic hormone: Secondary | ICD-10-CM

## 2015-04-28 DIAGNOSIS — J439 Emphysema, unspecified: Secondary | ICD-10-CM

## 2015-04-28 MED ORDER — MOMETASONE FURO-FORMOTEROL FUM 200-5 MCG/ACT IN AERO: 2.0000 | INHALATION_SPRAY | Freq: Two times a day (BID) | RESPIRATORY_TRACT | Status: DC

## 2015-04-28 MED ORDER — ALBUTEROL SULFATE HFA 108 (90 BASE) MCG/ACT IN AERS: 2.0000 | INHALATION_SPRAY | RESPIRATORY_TRACT | Status: DC | PRN

## 2015-04-28 MED ORDER — IPRATROPIUM-ALBUTEROL 0.5-2.5 (3) MG/3ML IN SOLN: 3.0000 mL | Freq: Two times a day (BID) | RESPIRATORY_TRACT | Status: DC

## 2015-04-28 MED ORDER — IBUPROFEN 600 MG PO TABS
600.0000 mg | ORAL_TABLET | ORAL | Status: DC | PRN
  Administered 2015-04-28: 600 mg via ORAL
  Filled 2015-04-28: qty 1

## 2015-04-28 MED ORDER — PREDNISONE 50 MG PO TABS: 50.0000 mg | ORAL_TABLET | Freq: Every day | ORAL | Status: DC

## 2015-04-28 MED ORDER — ACETAMINOPHEN 325 MG PO TABS: 650.0000 mg | ORAL_TABLET | Freq: Four times a day (QID) | ORAL | Status: DC | PRN

## 2015-04-28 MED ORDER — HYDROCODONE-ACETAMINOPHEN 5-325 MG PO TABS: 1.0000 | ORAL_TABLET | Freq: Four times a day (QID) | ORAL | Status: DC | PRN

## 2015-04-28 NOTE — Discharge Summary (Signed)
Family Medicine Teaching [facility] Discharge Summary  Patient name: Colton Owens Medical record number: [id number] Date of birth: [birth year] Age: 74 y.o. Gender: male Date of Admission: 04/25/2015  Date of Discharge: 04/28/15 Admitting Physician: Tobey GrimJeffrey H Walden, MD  Primary Care Provider: Lucilla EdinAUB, STEVE A, MD Consultants: none  Indication for Hospitalization: respiratory failure and hypotension  Discharge Diagnoses/Problem List:  COPD HTN Chronic cor pulmonale Chronic Hyponatremia AAA Constipation  Disposition: SNF  Discharge Condition: stable  Discharge Exam:  General: In NAD, alert and oriented x3. Thin body habitus. Sitting up in bed eating breakfast Cardiovascular: Regular rate, no murmur, no JVD, 3+ pitting edema to upper ankles bilaterally Respiratory: No increased WOB, decreased breath sounds, no wheezing, rhonchi, or rales appreciated. On 2L O2  Abdomen: positive bowel sounds, soft, nondistended, nontender Extremities: +3 pitting edema in bilateral ankles   Brief Hospital Course:  Patient presented to the ED on 7/9 for shortness of breath. EMS had brought him from his primary care provider where he was found to be hypotensive and hypoxic; O2 sat on his home 2L  was 80% with BP 82/79. His O2 was increased to 3.5L and his O2 saturation improved. He was also placed on continous albuterol nebulizer treatment and Solu-Medrol. He received a CXR which did not show any acute changes. Duonebs q6h / albuterol neb q3h prn / 5 day course with prednisone 40mg  were started. Patient's O2 saturation improved and on 7/12 he is on 2L O2 (his home dose) with O2 saturation in the high 90s. Additionally, his hypotension improved after his home antihypertensive medications were held. Patient is very thin and frail, PTOT recommended SNF placement where he will go today.   Other chronic conditions have been stable  Issues for Follow Up:  1. COPD 2. Blood pressure 3. Failure to  thrive  Significant Procedures: none  Significant Labs and Imaging:   Recent Labs Lab 04/25/15 1624  WBC 7.7  HGB 11.3*  HCT 36.8*  PLT 312    Recent Labs Lab 04/25/15 1624  NA 132*  K 4.6  CL 90*  CO2 38*  GLUCOSE 101*  BUN 19  CREATININE 0.75  CALCIUM 8.3*  ALKPHOS 95  AST 21  ALT 15*  ALBUMIN 2.8*     Results/Tests Pending at Time of Discharge: none  Discharge Medications:    Medication List    TAKE these medications        albuterol 108 (90 BASE) MCG/ACT inhaler  Commonly known as:  PROVENTIL HFA;VENTOLIN HFA  Inhale 2 puffs into the lungs every 4 (four) hours as needed for wheezing or shortness of breath. Take every 4 hrs for 3 days then as needed     alum & mag hydroxide-simeth 200-200-20 MG/5ML suspension  Commonly known as:  MAALOX/MYLANTA  Take 15 mLs by mouth every 6 (six) hours as needed for indigestion or heartburn.     antiseptic oral rinse 0.05 % Liqd solution  Commonly known as:  CPC / CETYLPYRIDINIUM CHLORIDE 0.05%  7 mLs by Mouth Rinse route 2 (two) times daily.     aspirin EC 81 MG tablet  Take 81 mg by mouth daily.     bisacodyl 10 MG suppository  Commonly known as:  DULCOLAX  Place 1 suppository (10 mg total) rectally daily as needed for moderate constipation.     docusate sodium 100 MG capsule  Commonly known as:  COLACE  Take 1 capsule (100 mg total) by mouth 2 (two) times daily.  feeding supplement (RESOURCE BREEZE) Liqd  Take 1 Container by mouth 3 (three) times daily between meals.     HYDROcodone-acetaminophen 5-325 MG per tablet  Commonly known as:  NORCO  Take 1 tablet by mouth every 6 (six) hours as needed for moderate pain.     Linaclotide 145 MCG Caps capsule  Commonly known as:  LINZESS  Take 1 capsule (145 mcg total) by mouth daily.     metoprolol 50 MG tablet  Commonly known as:  LOPRESSOR  Take 50 mg by mouth 2 (two) times daily.     mometasone-formoterol 200-5 MCG/ACT Aero  Commonly known as:   DULERA  Inhale 2 puffs into the lungs 2 (two) times daily.     nicotine 14 mg/24hr patch  Commonly known as:  NICODERM CQ - dosed in mg/24 hours  Place 1 patch (14 mg total) onto the skin daily.     ondansetron 4 MG tablet  Commonly known as:  ZOFRAN  Take 1 tablet (4 mg total) by mouth every 6 (six) hours as needed for nausea.     pantoprazole 40 MG tablet  Commonly known as:  PROTONIX  Take 1 tablet (40 mg total) by mouth daily.     polyethylene glycol packet  Commonly known as:  MIRALAX / GLYCOLAX  Take 17 g by mouth daily.     predniSONE 50 MG tablet  Commonly known as:  DELTASONE  Take 1 tablet (50 mg total) by mouth daily with breakfast.     simethicone 80 MG chewable tablet  Commonly known as:  MYLICON  Chew 1 tablet (80 mg total) by mouth every 6 (six) hours as needed for flatulence.        Discharge Instructions: Please refer to Patient Instructions section of EMR for full details.  Patient was counseled important signs and symptoms that should prompt return to medical care, changes in medications, dietary instructions, activity restrictions, and follow up appointments.   Follow-Up Appointments:   Beaulah Dinning, MD 04/28/2015, 9:59 AM PGY-1, [facility]

## 2015-04-28 NOTE — Progress Notes (Signed)
PT Cancellation Note  Patient Details Name: Colton Owens MRN: [id number] DOB: 1941-04-05   Cancelled Treatment:    Reason Eval/Treat Not Completed: Patient declined, no reason specified Pt declined participating in therapy this PM as pt supposed to be d/[facility] at [facility]. Will follow up if still in hospital.   Blake DivineShauna A Ruhan Borak 04/28/2015, 2:25 PM  Mylo RedShauna Amram Maya, PT, DPT [phone]

## 2015-04-28 NOTE — Progress Notes (Signed)
[facility]

## 2015-04-28 NOTE — Progress Notes (Signed)
Family Medicine Teaching Service Daily Progress Note Intern Pager: [phone]  Patient name: Colton Owens Medical record number: [id number] Date of birth: Dec 01, 1940 Age: 74 y.o. Gender: male  Primary Care Provider: Lucilla EdinAUB, STEVE A, MD Consultants: none Code Status: full  Pt Overview and Major Events to Date:  07/09: Admit to telemetry 07/10: 2L [place]  07/11: 2.5L [place]. PTOT recommends SNF 07/12: 2 L [place]   Assessment and Plan:  Acute on chronic respiratory failure due to COPD: Worsened hypoxemia (3.5L O2 requirement) on poor baseline pulmonary status (2L home-dependence). Also chronically hypercapneic (incompletely compensated respiratory acidosis on ABG). Continues to smoke (probable trigger). CXR without evidence of PNA or volume overload, BNP normal. Troponin negative, ECG stable and nonischemic. Doubt PE.  - Admit to FMTS, Dr. Gwendolyn GrantWalden, on telemetry for observation - Duonebs q6h / albuterol neb q3h prn - Given solumedrol x1, complete 5 day course with prednisone 40mg  daily x 4 days - Low threshold (AMS, arrhythmia, respiratory distress) for ABG - Per COPD gold protocol, consults to care management and social work have been placed - Tobacco cessation counseling provided, MAR states nicotine patch 14mg  but pt requests 21mg  which has been ordered.  -Pt at home dose of 2L O2 at 99%  Failure to thrive: Poor functional status worsening while living at home following 21-day SNF admission.   - CSW consulted as dispo to NH is likely safest option. Pt's daughter and son-in-law agree.  -PTOT recommended SNF  HTN/cor pulmonale: Hold home anti-hypertensives (metoprolol) given tenuous BPs in ED.  - Continue ASA 81mg   Chronic hyponatremia: Na 132, stable/above normal baseline. - Monitor  AAA: Stable 37mm with mural thrombus on CT Abd 03/11/2015.  - No intervention planned.  Constipation: Pt amenable to stool softeners but no laxatives.  - Colace BID -Added Senokot on 7/10  FEN/GI: Saline  lock, regular diet Prophylaxis: Lovenox  Disposition: telemetry Subjective:  Patient is sitting up in bed this morning awaiting his breathing treatment. He is in no acute distress. No acute events overnight and vital signs have remained stable. He continues to complain about constipation despite that he has had a bowel movement every day. He is breathing with mild increased respiratory effort and on 2 L [place] (his home dose). Patient [facility] not [facility] to go [facility] but he says he realizes he needs to go. Likely discharge today to SNF.  Objective: Temp:  [97.1 F (36.2 C)-98.4 F (36.9 C)] 98.4 F (36.9 C) (07/12 0515) Pulse Rate:  [94-108] 94 (07/12 0515) Resp:  [18] 18 (07/12 0515) BP: (104-118)/(66-79) 118/76 mmHg (07/12 0515) SpO2:  [97 %-100 %] 99 % (07/12 0515) Physical Exam: General: In NAD, alert and oriented x3. Thin body habitus. Sitting up in bed awaiting his breathing treatment Cardiovascular: Regular rate, no murmur, no JVD, 3+ pitting edema to upper ankles bilaterally Respiratory: Mildly increased WOB, decreased breath sounds, and faint rhonchi bilaterally. No wheezing or rales appreciated. On 2 L O2  Abdomen: positive bowel sounds, soft, nondistended, nontender Extremities: +2 pitting edema in bilateral ankles  Laboratory:  Recent Labs Lab 04/25/15 1624  WBC 7.7  HGB 11.3*  HCT 36.8*  PLT 312    Recent Labs Lab 04/25/15 1624  NA 132*  K 4.6  CL 90*  CO2 38*  BUN 19  CREATININE 0.75  CALCIUM 8.3*  PROT 5.9*  BILITOT 0.5  ALKPHOS 95  ALT 15*  AST 21  GLUCOSE 101*     Imaging/Diagnostic Tests: No information on file.  Beaulah Dinning, MD 04/28/2015, 7:25 AM PGY-1, [facility] FPTS Intern pager: [phone], text pages welcome

## 2015-04-28 NOTE — Discharge Instructions (Signed)
You were admitted for respiratory failure and low blood pressure. We gave you medication to help you breath better and get back to your baseline. We also monitored you until your blood pressure went back to normal. [facility]. Please go to the emergency room if your breathing becomes more labored or you feel as if you can't catch your breath.   Chronic Obstructive Pulmonary Disease Chronic obstructive pulmonary disease (COPD) is a common lung condition in which airflow from the lungs is limited. COPD is a general term that can be used to describe many different lung problems that limit airflow, including both chronic bronchitis and emphysema. If you have COPD, your lung function will probably never return to normal, but there are measures you can take to improve lung function and make yourself feel better.  CAUSES   Smoking (common).   Exposure to secondhand smoke.   Genetic problems.  Chronic inflammatory lung diseases or recurrent infections. SYMPTOMS   Shortness of breath, especially with physical activity.   Deep, persistent (chronic) cough with a large amount of thick mucus.   Wheezing.   Rapid breaths (tachypnea).   Gray or bluish discoloration (cyanosis) of the skin, especially in fingers, toes, or lips.   Fatigue.   Weight loss.   Frequent infections or episodes when breathing symptoms become much worse (exacerbations).   Chest tightness. DIAGNOSIS  Your health care provider will take a medical history and perform a physical examination to make the initial diagnosis. Additional tests for COPD may include:   Lung (pulmonary) function tests.  Chest X-ray.  CT scan.  Blood tests. TREATMENT  Treatment available to help you feel better when you have COPD includes:   Inhaler and nebulizer medicines. These help manage the symptoms of COPD and make your breathing more comfortable.  Supplemental oxygen.  Supplemental oxygen is only helpful if you have a low oxygen level in your blood.   Exercise and physical activity. These are beneficial for nearly all people with COPD. Some people may also benefit from a pulmonary rehabilitation program. HOME CARE INSTRUCTIONS   Take all medicines (inhaled or pills) as directed by your health care provider.  Avoid over-the-counter medicines or cough syrups that dry up your airway (such as antihistamines) and slow down the elimination of secretions unless instructed otherwise by your health care provider.   If you are a smoker, the most important thing that you can do is stop smoking. Continuing to smoke will cause further lung damage and breathing trouble. Ask your health care provider for help with quitting smoking. He or she can direct you to community resources or hospitals that provide support.  Avoid exposure to irritants such as smoke, chemicals, and fumes that aggravate your breathing.  Use oxygen therapy and pulmonary rehabilitation if directed by your health care provider. If you require home oxygen therapy, ask your health care provider whether you should purchase a pulse oximeter to measure your oxygen level at home.   Avoid contact with individuals who have a contagious illness.  Avoid extreme temperature and humidity changes.  Eat healthy foods. Eating smaller, more frequent meals and resting before meals may help you maintain your strength.  Stay active, but balance activity with periods of rest. Exercise and physical activity will help you maintain your ability to do things you want to do.  Preventing infection and hospitalization is very important when you have COPD. Make sure to receive all the vaccines your health care  provider recommends, especially the pneumococcal and influenza vaccines. Ask your health care provider whether you need a pneumonia vaccine.  Learn and use relaxation techniques to manage stress.  Learn and use  controlled breathing techniques as directed by your health care provider. Controlled breathing techniques include:   Pursed lip breathing. Start by breathing in (inhaling) through your nose for 1 second. Then, purse your lips as if you were going to whistle and breathe out (exhale) through the pursed lips for 2 seconds.   Diaphragmatic breathing. Start by putting one hand on your abdomen just above your waist. Inhale slowly through your nose. The hand on your abdomen should move out. Then purse your lips and exhale slowly. You should be able to feel the hand on your abdomen moving in as you exhale.   Learn and use controlled coughing to clear mucus from your lungs. Controlled coughing is a series of short, progressive coughs. The steps of controlled coughing are:  1. Lean your head slightly forward.  2. Breathe in deeply using diaphragmatic breathing.  3. Try to hold your breath for 3 seconds.  4. Keep your mouth slightly open while coughing twice.  5. Spit any mucus out into a tissue.  6. Rest and repeat the steps once or twice as needed. SEEK MEDICAL CARE IF:   You are coughing up more mucus than usual.   There is a change in the color or thickness of your mucus.   Your breathing is more labored than usual.   Your breathing is faster than usual.  SEEK IMMEDIATE MEDICAL CARE IF:   You have shortness of breath while you are resting.   You have shortness of breath that prevents you from:  Being able to talk.   Performing your usual physical activities.   You have chest pain lasting longer than 5 minutes.   Your skin color is more cyanotic than usual.  You measure low oxygen saturations for longer than 5 minutes with a pulse oximeter. MAKE SURE YOU:   Understand these instructions.  Will watch your condition.  Will get help right away if you are not doing well or get worse. Document Released: 07/13/2005 Document Revised: 02/17/2014 Document Reviewed:  05/30/2013 [facility] Patient Information 2015 [place], [place]. This information is not intended to replace advice given to you by your health care provider. Make sure you discuss any questions you have with your health care provider.

## 2015-04-28 NOTE — Clinical Social Work Note (Signed)
Patient to be d/c'ed today to [facility].  Patient and family agreeable to plans will transport via ems RN to call report. Cheng Dec, MSW, LCSWA [phone]  

## 2015-04-29 ENCOUNTER — Ambulatory Visit: Payer: Medicare Other | Admitting: Family

## 2015-04-29 ENCOUNTER — Telehealth: Payer: Self-pay | Admitting: Emergency Medicine

## 2015-04-29 ENCOUNTER — Non-Acute Institutional Stay: Payer: Medicare Other | Admitting: Family Medicine

## 2015-04-29 ENCOUNTER — Encounter: Payer: Self-pay | Admitting: Family Medicine

## 2015-04-29 DIAGNOSIS — J449 Chronic obstructive pulmonary disease, unspecified: Secondary | ICD-10-CM

## 2015-04-29 DIAGNOSIS — J9611 Chronic respiratory failure with hypoxia: Secondary | ICD-10-CM

## 2015-04-29 DIAGNOSIS — I714 Abdominal aortic aneurysm, without rupture, unspecified: Secondary | ICD-10-CM

## 2015-04-29 DIAGNOSIS — Z593 Problems related to living in residential institution: Secondary | ICD-10-CM

## 2015-04-29 DIAGNOSIS — I10 Essential (primary) hypertension: Secondary | ICD-10-CM

## 2015-04-29 DIAGNOSIS — Z0289 Encounter for other administrative examinations: Secondary | ICD-10-CM

## 2015-04-29 DIAGNOSIS — K5900 Constipation, unspecified: Secondary | ICD-10-CM

## 2015-04-29 MED ORDER — FORMOTEROL FUMARATE 20 MCG/2ML IN NEBU: 20.0000 ug | INHALATION_SOLUTION | Freq: Two times a day (BID) | RESPIRATORY_TRACT | Status: DC

## 2015-04-29 MED ORDER — IPRATROPIUM-ALBUTEROL 0.5-2.5 (3) MG/3ML IN SOLN: 3.0000 mL | RESPIRATORY_TRACT | Status: DC | PRN

## 2015-04-29 MED ORDER — SENNOSIDES-DOCUSATE SODIUM 8.6-50 MG PO TABS: 2.0000 | ORAL_TABLET | Freq: Every day | ORAL | Status: DC

## 2015-04-29 MED ORDER — BUDESONIDE 0.5 MG/2ML IN SUSP: 0.5000 mg | Freq: Every day | RESPIRATORY_TRACT | Status: DC

## 2015-04-29 MED ORDER — MORPHINE SULFATE (CONCENTRATE) 20 MG/ML PO SOLN: 6.0000 mg | ORAL | Status: DC | PRN

## 2015-04-29 NOTE — Assessment & Plan Note (Addendum)
Final stages of COPD, amenable to palliative care consult to address symptom management and goals of care.

## 2015-04-29 NOTE — Assessment & Plan Note (Signed)
On metoprolol  BID, was held temporarily due to hypotension in hospital. Will monitor HR and BP and consider lowering dose / DCing this. Continue ASA 

## 2015-04-29 NOTE — Assessment & Plan Note (Signed)
Stable 37mm with mural thrombus on CT Abd 03/11/2015.  - No intervention planned.

## 2015-04-29 NOTE — Assessment & Plan Note (Signed)
Improved somewhat from beginning of hospitalization with steroids and nebs.  - Continue prednisone 50mg  daily with a stop date of 7/15 - Will start controller medication in nebulizer as I doubt MDI is reaching his lungs. Will Rx components (budesonide and formoterol) separately.  - Start duoneb q4h prn dyspnea - Start roxanol 20 mg/mL to take 0.3 ml every 4 hours as needed for shortness of breath or pain. - Continue abstinence from smoking, continuing nicotine patch.

## 2015-04-29 NOTE — Progress Notes (Signed)
Family Medicine Teaching [facility] Admission History and Physical Service Pager: [phone]  Patient name: Colton Owens Medical record number: [id number] Date of birth: 20-Sep-1941 Age: 74 y.o. Gender: male  Primary Care Provider: Lucilla Edin, MD Consultants: None Code Status: Full  Chief Complaint: Hospital discharge, shortness of breath  HPI: Colton Owens is a 74 y.o. male presenting to [facility] after discharge from [facility] where he was admitted for acute on chronic respiratory failure due to a COPD exacerbation. PMH is significant for COPD, HTN, HLD, AAA.   He sent to [facility] 7/9 from his PCP due to hypoxemia and hypotension and subsequently was admitted with an increased oxygen requirement (3.5 L/min). He had been hospitalized in May for COPD and failure to thrive, and spent 21 days at SNF before returning home with home health services (PT, OT, SW). COPD exacerbation was treated with scheduled and prn nebulizer treatments and steroids (initially solumedrol followed by prednisone planned for a total of 5 days). Chest xray showed hyperinflated lungs without infiltrate or signs of heart failure. No antibiotics were started.He was on no inhaled medications other than albuterol, so a controller medication, dulera, was started with recommendations to continue following discharge. His hypoxemia steadily improved and he returned to his baseline 2L O2 by nasal cannula. Physical therapy evaluation recommended SNF placement at discharge.   Hypotension was noted, without signs of sepsis, and resolved when home antihypertensives were held.   Colton Owens has chronic constipation and is receiving an opioid prn dyspnea  On arrival he reports continued dyspnea but improved overall from admission to the hospital.    Review Of Systems: He denies chest pain, any other pain, nausea, vomiting, changes in vision or hearing. He says he feels weak and nods in agreement with the likely outcome  being not returning home for the foreseeable future.  Patient Active Problem List   Diagnosis Date Noted  . Person living in residential institution 04/29/2015  . Acute on chronic respiratory failure, unspecified whether with hypoxia or hypercapnia   . COLD (chronic obstructive lung disease)   . Arterial hypotension   . Acute on chronic respiratory failure 04/25/2015  . Psychosis 03/28/2015  . Protein-calorie malnutrition, severe 03/12/2015  . Nausea & vomiting 03/11/2015  . CN (constipation) 03/11/2015  . Chronic respiratory failure with hypoxia 03/01/2015  . COPD (chronic obstructive pulmonary disease) 03/01/2015  . Debility 03/01/2015  . Bilateral flank pain   . Hyponatremia 02/28/2015  . Bilateral lower extremity edema 03/01/2013  . Essential hypertension, benign 09/09/2012  . Other and unspecified hyperlipidemia 09/09/2012  . Syncope and collapse 09/09/2012  . Abdominal aortic aneurysm 08/29/2012   Past Medical History: Past Medical History  Diagnosis Date  . Hypertension   . High cholesterol   . Hyponatremia syndrome   . Chronic cor pulmonale   . COPD (chronic obstructive pulmonary disease)   . Aortic aneurysm   . Chronic respiratory failure with hypoxia 03/01/2015   Past Surgical History: Past Surgical History  Procedure Laterality Date  . Angioplasty    . Left heart catheterization with coronary angiogram N/A 10/23/2012    Procedure: LEFT HEART CATHETERIZATION WITH CORONARY ANGIOGRAM;  Surgeon: [name], MD;  Location: [facility] CATH LAB;  Service: Cardiovascular;  Laterality: N/A;  . Abdominal angiogram N/A 10/23/2012    Procedure: ABDOMINAL ANGIOGRAM;  Surgeon: [name], MD;  Location: [facility] CATH LAB;  Service: Cardiovascular;  Laterality: N/A;   Social History: History  Substance Use Topics  .  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 Smokeless tobacco: Never Used  . Alcohol Use: No   Please also  refer to relevant sections of EMR.  Family History: No family history on file. Allergies and Medications: No Known Allergies Current Outpatient Prescriptions on File Prior to Visit  Medication Sig Dispense Refill  . albuterol (PROVENTIL HFA;VENTOLIN HFA) 108 (90 BASE) MCG/ACT inhaler Inhale 2 puffs into the lungs every 4 (four) hours as needed for wheezing or shortness of breath. Take every 4 hrs for 3 days then as needed 1 Inhaler 2  . alum & mag hydroxide-simeth (MAALOX/MYLANTA) 200-200-20 MG/5ML suspension Take 15 mLs by mouth every 6 (six) hours as needed for indigestion or heartburn. (Patient not taking: Reported on 04/25/2015) 355 mL 0  . antiseptic oral rinse (CPC / CETYLPYRIDINIUM CHLORIDE 0.05%) 0.05 % LIQD solution 7 mLs by Mouth Rinse route 2 (two) times daily. (Patient not taking: Reported on 04/25/2015)  0  . aspirin EC 81 MG tablet Take 81 mg by mouth daily.    . bisacodyl (DULCOLAX) 10 MG suppository Place 1 suppository (10 mg total) rectally daily as needed for moderate constipation. (Patient not taking: Reported on 04/25/2015) 12 suppository 0  . docusate sodium (COLACE) 100 MG capsule Take 1 capsule (100 mg total) by mouth 2 (two) times daily. (Patient not taking: Reported on 04/25/2015) 10 capsule 0  . feeding supplement, RESOURCE BREEZE, (RESOURCE BREEZE) LIQD Take 1 Container by mouth 3 (three) times daily between meals.  0  . HYDROcodone-acetaminophen (NORCO) 5-325 MG per tablet Take 1 tablet by mouth every 6 (six) hours as needed for moderate pain. 60 tablet 0  . Linaclotide (LINZESS) 145 MCG CAPS capsule Take 1 capsule (145 mcg total) by mouth daily. (Patient not taking: Reported on 04/25/2015) 30 capsule   . metoprolol (LOPRESSOR) 50 MG tablet Take 50 mg by mouth 2 (two) times daily.   1  . mometasone-formoterol (DULERA) 200-5 MCG/ACT AERO Inhale 2 puffs into the lungs 2 (two) times daily. 1 Inhaler 6  . nicotine (NICODERM CQ - DOSED IN MG/24 HOURS) 14 mg/24hr patch Place 1 patch  (14 mg total) onto the skin daily. (Patient not taking: Reported on 04/25/2015) 28 patch 0  . ondansetron (ZOFRAN) 4 MG tablet Take 1 tablet (4 mg total) by mouth every 6 (six) hours as needed for nausea. (Patient not taking: Reported on 04/25/2015) 20 tablet 0  . pantoprazole (PROTONIX) 40 MG tablet Take 1 tablet (40 mg total) by mouth daily. (Patient not taking: Reported on 04/25/2015)    . polyethylene glycol (MIRALAX / GLYCOLAX) packet Take 17 g by mouth daily. (Patient not taking: Reported on 04/25/2015) 14 each 0  . predniSONE (DELTASONE) 50 MG tablet Take 1 tablet (50 mg total) by mouth daily with breakfast. 3 tablet 0  . simethicone (MYLICON) 80 MG chewable tablet Chew 1 tablet (80 mg total) by mouth every 6 (six) hours as needed for flatulence. (Patient not taking: Reported on 04/25/2015) 30 tablet 0   No current facility-administered medications on file prior to visit.    Objective: There were no vitals taken for this visit. Exam: General: Frail elderly male sitting up in bed  ENTM: Poor dentition, MMM, oropharynx clear Neck: Full ROM Cardiovascular: Regular rate, no murmur, no JVD, 1+ pitting pedal edema Respiratory: Normal rate and effort on 3L O2 by nasal cannula.  Abdomen: +BS, soft, NT, ND MSK: No deformities. Diffusely weak  with an ability to stand only for 1 minute at bedside.  Skin: Some digital cyanosis without clubbing. No wounds.  Neuro: Alert and oriented, no focal deficits, slow motions without focal deficit in strength Psych: Normal mood and congruent affect.   Labs and Imaging: CBC BMET   Recent Labs Lab 04/25/15 1624  WBC 7.7  HGB 11.3*  HCT 36.8*  PLT 312    Recent Labs Lab 04/25/15 1624  NA 132*  K 4.6  CL 90*  CO2 38*  BUN 19  CREATININE 0.75  GLUCOSE 101*  CALCIUM 8.3*     See problem list for details concerning plan of care.   Tyrone Nineyan B Kashawna Manzer, MD 04/29/2015, 8:58 AM PGY-3,  Family Medicine

## 2015-04-29 NOTE — Progress Notes (Signed)
Patient ID: Colton Owens, male   DOB: 1940-10-25, 74 y.o.   MRN: [id number] I have seen and examined this patient. I have reviewed labs and imaging results.  I have discussed with Dr Colton Owens.  I agree with their findings and plans as documented in their Nursing home admission note.  [name] has Stage D COPD with recent Acute Exacerbation of COPD requiring hospitalization again.  He lives alone without assistance, being unable to perform his ADLs due to dyspnea on minimal exertion, e.g. dressing.  [facility]/OT and Skilled Nursing care.   He may reach a plateau in physical function during SNF stay that is still inadequate to successfully manage living independently alone.  He [facility] which is acceptable to him if finances to pay for ALF can be arranged.  He will likely have to cell his trailer home and turn over much of his income (SSI + monthly payment form [employer]) to the ALF.  His income apparently disqualifies [facility].  He may not be willing to go through spend down process to qualify for Medicaid.  Will complete total of 7 days of antibiotics and 5 days of pulse does prednisone.  Continue home rescue and daily controller inhalers.  Consult with PT and OT for skilled care.  Consult with Nutrition for moderate to sever  Protein-calorcic malnutrition (Albumin 2.8 on 04/25/15). Consult with Palliative Care Medicine for Goals of Care and recommendations for dyspnea palliation.

## 2015-04-29 NOTE — Assessment & Plan Note (Signed)
Chronic problem worsened by opioids.  - Start Senokot-S 2 tab po daily

## 2015-05-05 NOTE — Progress Notes (Signed)
Late entry for missed G-code. Based on review of the evaluation and goals by Narda AmberJennifer Martin, PT.   03/01/15 1301  PT G-Codes **NOT FOR INPATIENT CLASS**  Functional Assessment Tool Used Clinical judgement based on review of medical record  Functional Limitation Mobility: Walking and moving around  Mobility: Walking and Moving Around Current Status ([id number](G8978) CI  Mobility: Walking and Moving Around Goal Status [phone](G8979) CI  Mobility: Walking and Moving Around Discharge Status [phone](G8980) CI  Lavona MoundMark Maelynn Moroney, [place]PT  [phone] 05/05/2015

## 2015-05-20 ENCOUNTER — Encounter: Payer: Self-pay | Admitting: Family Medicine

## 2015-05-20 ENCOUNTER — Non-Acute Institutional Stay: Payer: Medicare Other | Admitting: Family Medicine

## 2015-05-20 DIAGNOSIS — J449 Chronic obstructive pulmonary disease, unspecified: Secondary | ICD-10-CM

## 2015-05-20 DIAGNOSIS — I10 Essential (primary) hypertension: Secondary | ICD-10-CM | POA: Diagnosis not present

## 2015-05-20 DIAGNOSIS — F17200 Nicotine dependence, unspecified, uncomplicated: Secondary | ICD-10-CM | POA: Insufficient documentation

## 2015-05-20 DIAGNOSIS — G3184 Mild cognitive impairment, so stated: Secondary | ICD-10-CM | POA: Insufficient documentation

## 2015-05-20 DIAGNOSIS — Z720 Tobacco use: Secondary | ICD-10-CM

## 2015-05-20 DIAGNOSIS — Z602 Problems related to living alone: Secondary | ICD-10-CM | POA: Diagnosis not present

## 2015-05-20 DIAGNOSIS — R109 Unspecified abdominal pain: Secondary | ICD-10-CM | POA: Diagnosis not present

## 2015-05-20 DIAGNOSIS — M8580 Other specified disorders of bone density and structure, unspecified site: Secondary | ICD-10-CM | POA: Insufficient documentation

## 2015-05-20 MED ORDER — MORPHINE SULFATE (CONCENTRATE) 20 MG/ML PO SOLN: 12.0000 mg | ORAL | Status: DC | PRN

## 2015-05-20 MED ORDER — METOPROLOL SUCCINATE ER 100 MG PO TB24: 100.0000 mg | ORAL_TABLET | Freq: Every day | ORAL | Status: DC

## 2015-05-20 MED ORDER — CALCIUM CARBONATE-VITAMIN D 500-400 MG-UNIT PO TABS: 2.0000 | ORAL_TABLET | Freq: Every day | ORAL | Status: DC

## 2015-05-20 MED ORDER — NICOTINE 14 MG/24HR TD PT24: 14.0000 mg | MEDICATED_PATCH | Freq: Every day | TRANSDERMAL | Status: DC

## 2015-05-20 MED ORDER — HYDROCODONE-ACETAMINOPHEN 5-325 MG PO TABS: 1.0000 | ORAL_TABLET | Freq: Four times a day (QID) | ORAL | Status: DC

## 2015-05-20 MED ORDER — CALCITONIN (SALMON) 200 UNIT/ACT NA SOLN: 1.0000 | Freq: Every day | NASAL | Status: DC

## 2015-05-20 MED ORDER — ARFORMOTEROL TARTRATE 15 MCG/2ML IN NEBU: 15.0000 ug | INHALATION_SOLUTION | Freq: Two times a day (BID) | RESPIRATORY_TRACT | Status: DC

## 2015-05-20 NOTE — Assessment & Plan Note (Signed)
New issue  Patient scored 23 out of 30 on BASIC-MoCA which is below expected for his 7 years of formal education His dependencies in iADLs and ADLs appear to be predominantly due to his severe respiratory compromise, and not due to cognitive impairment. He does not display significant mood disorder nor delirium. While he is on Seroquel, this was started by [facility] attending in June 2016 for question of psychosis.  I would like to stop this atypical antipsychotic to see if there is evidence of psychosis.  I do not see a previous history of major mental disorder for Colton Owens.  Would repeat MoCa in 6 to 12 months to see if improves, stays stable, or declines.  Decline would be considered c/w a chronic progressive neurodegenerative process.

## 2015-05-20 NOTE — Assessment & Plan Note (Signed)
Established problem Abstinent since 04/24/15. Need to start titrating down his Nicotine patch. Decrease to 14 mg/24 hr for couple weeks, then 7 mg/24 hr patch for couple weeks then stop

## 2015-05-20 NOTE — Addendum Note (Signed)
Addended by: Hazeline Junker B on: 05/20/2015 03:20 PM   Modules accepted: Orders, Medications

## 2015-05-20 NOTE — Progress Notes (Signed)
Patient ID: Colton Owens, male   DOB: 26-Jun-1941, 74 y.o.   MRN: [id number] [facility]  Visit  Primary Care Provider: Dr Cleta Alberts at [facility] Location of Care: [facility] Visit Information: an urgent visit for an existing problem  Patient accompanied by patient Source(s) of information for visit: patient, nursing home and past medical records  Chief Complaint:  Chief Complaint  Patient presents with  . Back Pain    Nursing Concerns: No concerns.  Pt is not compliant with fluid restrictions.  Nutrition Concerns: Eating 100% meals Wound Care Nurse Concerns: none PT / OT Concerns: Working on functional performance with WC, Stair climbing and transfers related to ambulation  HISTORY OF PRESENT ILLNESS:  Admission to [facility] after ~ 4 day Hospital stay for COPD, hypoxemia and hypovoemic hypotension.  He is participating with PT and OT, though his right flank pain worsens with PT and OT.   Back pain, subacute Onset: About three weeks ago Location: Initially mid-back pain with radiation bilaterally around to flanks, than has now localized to Right posterior lower thorax Quality: aching with intermittent sharp Severity: 6/10 Function: interferes with PT/OT sessions Duration: 3 weeks Pattern: continuous with occasional sharp exacerbations Course: slowly improving Radiation: no longer radiating  Relief: brief relief with Norco 5 mg or Roxanol 6 mg.  Precipitant: Started while holding a medicine ball with arms extended during PT/OT session soon after admission to [facility] Associated Symptoms: no fever.  No new shortness of breath, no cough.  No hemoptysis Trauma (Acute or Chronic): No impact trauma.  Prior Diagnostic Testing or Treatments: 04/25/15 CXR: New midthoracic vertebral compression fractures from May 2016 CTA-Chest.  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  Relevant PMH/PSH: COPD, Smoking, Prednisone exposure  Memory Impairment, question - Patient admits to long standing  difficulty with memory - He has been taking care of his iADLs and ADLs except as limited by his respiratory impairment.  - he denies any recent decline in his memory.  He manages his own finances.     Outpatient Encounter Prescriptions as of 05/20/2015  Medication Sig  . aspirin EC 81 MG tablet Take 81 mg by mouth daily.  . budesonide (PULMICORT) 0.5 MG/2ML nebulizer solution Take 2 mLs (0.5 mg total) by nebulization daily.  Marland Kitchen docusate sodium (COLACE) 100 MG capsule Take 1 capsule (100 mg total) by mouth 2 (two) times daily.  . feeding supplement, RESOURCE BREEZE, (RESOURCE BREEZE) LIQD Take 1 Container by mouth 3 (three) times daily between meals.  . formoterol (PERFOROMIST) 20 MCG/2ML nebulizer solution Take 2 mLs (20 mcg total) by nebulization 2 (two) times daily.  Marland Kitchen HYDROcodone-acetaminophen (NORCO) 5-325 MG per tablet Take 1 tablet by mouth every 6 (six) hours as needed for moderate pain.  . Linaclotide (LINZESS) 145 MCG CAPS capsule Take 1 capsule (145 mcg total) by mouth daily.  Marland Kitchen METOPROLOL SUCCINATE ER PO Take 50 mg by mouth 2 (two) times daily.  . mometasone-formoterol (DULERA) 100-5 MCG/ACT AERO Inhale 2 puffs into the lungs 2 (two) times daily.  Marland Kitchen morphine (ROXANOL) 20 MG/ML concentrated solution Take 0.3 mLs (6 mg total) by mouth every 3 (three) hours as needed for severe pain or shortness of breath.  . nicotine (NICODERM CQ - DOSED IN MG/24 HOURS) 21 mg/24hr patch Place 21 mg onto the skin daily.  . OXYGEN Inhale 2 L/min into the lungs continuous.  . pantoprazole (PROTONIX) 40 MG tablet Take 1 tablet (40 mg total) by mouth daily.  . polyethylene glycol (MIRALAX / GLYCOLAX) packet  Take 17 g by mouth daily.  . predniSONE (DELTASONE) 10 MG tablet Take 30 mg by mouth daily with breakfast.  . QUEtiapine (SEROQUEL) 25 MG tablet Take 25 mg by mouth at bedtime.  . senna-docusate (SENOKOT S) 8.6-50 MG per tablet Take 2 tablets by mouth daily.  Marland Kitchen albuterol (PROVENTIL HFA;VENTOLIN HFA) 108  (90 BASE) MCG/ACT inhaler Inhale 2 puffs into the lungs every 4 (four) hours as needed for wheezing or shortness of breath. Take every 4 hrs for 3 days then as needed (Patient not taking: Reported on 05/20/2015)  . alum & mag hydroxide-simeth (MAALOX/MYLANTA) 200-200-20 MG/5ML suspension Take 15 mLs by mouth every 6 (six) hours as needed for indigestion or heartburn. (Patient not taking: Reported on 04/25/2015)  . antiseptic oral rinse (CPC / CETYLPYRIDINIUM CHLORIDE 0.05%) 0.05 % LIQD solution 7 mLs by Mouth Rinse route 2 (two) times daily. (Patient not taking: Reported on 05/20/2015)  . bisacodyl (DULCOLAX) 10 MG suppository Place 1 suppository (10 mg total) rectally daily as needed for moderate constipation. (Patient not taking: Reported on 04/25/2015)  . ipratropium-albuterol (DUONEB) 0.5-2.5 (3) MG/3ML SOLN Take 3 mLs by nebulization every 4 (four) hours as needed (shortness of breath). (Patient not taking: Reported on 05/20/2015)  . ondansetron (ZOFRAN) 4 MG tablet Take 1 tablet (4 mg total) by mouth every 6 (six) hours as needed for nausea. (Patient not taking: Reported on 04/25/2015)  . simethicone (MYLICON) 80 MG chewable tablet Chew 1 tablet (80 mg total) by mouth every 6 (six) hours as needed for flatulence. (Patient not taking: Reported on 04/25/2015)  . [DISCONTINUED] metoprolol (LOPRESSOR) 50 MG tablet Take 50 mg by mouth 2 (two) times daily.   . [DISCONTINUED] nicotine (NICODERM CQ - DOSED IN MG/24 HOURS) 14 mg/24hr patch Place 1 patch (14 mg total) onto the skin daily. (Patient not taking: Reported on 04/25/2015)  . [DISCONTINUED] predniSONE (DELTASONE) 50 MG tablet Take 1 tablet (50 mg total) by mouth daily with breakfast.   No facility-administered encounter medications on file as of 05/20/2015.   No Known Allergies History Patient Active Problem List   Diagnosis Date Noted  . Person living in residential institution 04/29/2015  . COLD (chronic obstructive lung disease)   . Protein-calorie  malnutrition, severe 03/12/2015  . CN (constipation) 03/11/2015  . Chronic respiratory failure with hypoxia 03/01/2015  . COPD (chronic obstructive pulmonary disease) 03/01/2015  . Debility 03/01/2015  . Hyponatremia 02/28/2015  . Essential hypertension, benign 09/09/2012  . Other and unspecified hyperlipidemia 09/09/2012  . Abdominal aortic aneurysm 08/29/2012   Past Medical History  Diagnosis Date  . Hypertension   . High cholesterol   . Hyponatremia syndrome   . Chronic cor pulmonale   . COPD (chronic obstructive pulmonary disease)   . Aortic aneurysm   . Chronic respiratory failure with hypoxia 03/01/2015  . Acute on chronic respiratory failure, unspecified whether with hypoxia or hypercapnia   . Acute on chronic respiratory failure 04/25/2015  . Arterial hypotension   . Bilateral flank pain   . Bilateral lower extremity edema 03/01/2013  . CN (constipation) 03/11/2015  . COLD (chronic obstructive lung disease)   . Debility 03/01/2015  . Essential hypertension, benign 09/09/2012  . Hyponatremia 02/28/2015  . Other and unspecified hyperlipidemia 09/09/2012  . Protein-calorie malnutrition, severe 03/12/2015  . Psychosis 03/28/2015  . Syncope and collapse 09/09/2012   Past Surgical History  Procedure Laterality Date  . Angioplasty    . Left heart catheterization with coronary angiogram N/A 10/23/2012  Procedure: LEFT HEART CATHETERIZATION WITH CORONARY ANGIOGRAM;  Surgeon: [name], MD;  Location[facility] CATH LAB;  Service: Cardiovascular;  Laterality: N/A;  . Abdominal angiogram N/A 10/23/2012    Procedure: ABDOMINAL ANGIOGRAM;  Surgeon: [name], MD;  Location: [facility] CATH LAB;  Service: Cardiovascular;  Laterality: N/A;   No family history on file.  reports that he has been smoking Cigarettes.  He has a 58 pack-year smoking history. He has never used smokeless tobacco. He reports that he does not drink alcohol or use illicit drugs.  Basic Activities of Daily Living    ADLs Independent Needs Assistance Dependent  Bathing  x   Dressing x    Ambulation  x   Toileting x    Eating x        Diet:  general with fluid restriction for possible SIADH Supplemental shakes:  Yes, Healthy Shake twice a day and Med Pass 120 mg BID  Review of Systems  Patient has ability to communicate answers to ROS: yes See HPI  Geriatric Syndromes: Constipation yes ,   Incontinence no  Dizziness no   Syncope no   Skin problems no   Visual Impairment yes   Hearing impairment no  Eating impairment no  Impaired Memory or Cognition yes   Behavioral problems no   Sleep problems no   Weight loss Improved weight since admission to [facility]    Pain:  Pain Location: Back Pain Rating: He rates his pain as moderate.  Pain Duration: weeks Pain Therapies: Vicodin and MSIR Pain Response to Therapies: Initial pain relief that lasts for less than an hour Bowel Movement Difficulty: less frequent than usual daily BM  Dyspnea: Dyspnea Rating: 2 = some difficulty, noticeable to observer Dyspnea Goal: mild Dyspnea Therapies: systemic steroids and ICS, LABA Dyspnea Response to Therapies: fair   General: Denies fevers, chills, weight loss, fatigue, weight gain.  Eyes: Denies pain, blurred vision  Ears/Nose/Throat: Denies ear pain, throat pain, rhinorrhea, nasal congestion.  Cardiovascular: Denies chest pains, palpitations, dyspnea on exertion, orthopnea, peripheral edema.  Respiratory: Denies cough, sputum, dyspnea.  Gastrointestinal: Denies abd pain, bloating, constipation, diarrhea.  Genitourinary: Denies dysuria, urinary frequency, discharge Musculoskeletal: Denies joint pain, swelling, weakness.  Skin: Denies skin rash or ulcers. Neurologic: Denies transient paralysis, weakness, paresthesias, headache.  Psychiatric: Denies depression, anxiety, psychosis. Endocrine: Denies weight change.   PHYSICAL EXAM:. Wt Readings from Last 3 Encounters:  05/15/15 139 lb 9.6 oz  (63.322 kg)  04/25/15 134 lb (60.782 kg)  04/25/15 130 lb (58.968 kg)   Temp Readings from Last 3 Encounters:  05/20/15 98.8 F (37.1 C)   04/28/15 98.1 F (36.7 C) Oral  04/25/15 97.8 F (36.6 C) Oral   BP Readings from Last 3 Encounters:  05/20/15 110/74  04/28/15 105/67  04/25/15 82/79   Pulse Readings from Last 3 Encounters:  05/20/15 81  04/28/15 102  04/25/15 81    General: alert, cooperative, no distress, well nourished, pleasant, clean, groomed HEENT:  No scleral icterus, no nasal secretions, Oromucosa moist and no erythema or lesion Neck:  Supple, No JVD, no lymphadenopathy CV:  RRR, no murmur, no ankle swelling RESP: No resp distress or accessory muscle use.  Distant breath sounds EXT: Warm and well perfused   no edema, no erythema, pulses WNL Gait:  Not tested Psych:  Orientation oriented to person, place, time, and general circumstances; Judgment Good, Insight Intact Memory recent and remote memory intact; Attention Normal;  Mood appropriate; Speech normal; Language Barrier:  none ; Thought Coherent    BASIC- MoCa:  23/ 30 Years of Education: < 8  PHQ-2: 0  Assessment and Plan:   See Problem List for individual problem's assessment and plans.     Advanced Directives (MOST form, Living Will, HCPOA): No HCPOA, No Living Will.  Emergency Contact:  Cannon,Pamela Daughter [phone]    Campbell Riches [phone]      Code Status:    Full Code  Follow Up:  Next 30 days unless acute issues arise.

## 2015-05-20 NOTE — Assessment & Plan Note (Signed)
New problem Focal tenderness over floating right ribs No tendernous of spinous processes percussion Know mid-thoracic new thoracic vert compression fractures on 04/25/15 CXR  Suspect pain is bony in origin Hesistant to start NSAID in this patient.  Will repeat Xrays of painful region to look for fxs. Starting Calcitonin NS Increase roxinol to 12 mg po every 3 hours pain. Monitor response

## 2015-05-20 NOTE — Assessment & Plan Note (Signed)
New issue Colton Owens is willing to discuss discharge to ALF as he does not think he will be able to live alone any longer.   We consulted [facility] SW to explore ALF placement at [facility] discharge.

## 2015-05-20 NOTE — Assessment & Plan Note (Signed)
Established problem Stable Will titrate slowly off prednisone 20 mg daily for week, then 10 mg daily for week, than 7.5 mg daily for week, then 5 mg daily for week, then 5 mg QOD Stop Dulera as it is duplicating the Formoterol neb and budesonide neb daily therapy

## 2015-05-20 NOTE — Assessment & Plan Note (Addendum)
New Issue Risk Facotrs for osteoporosis is corticosteroid exposures and tobacco abuse TSH within normal limits in May 2016. Need to start patient on Bisphosphonate therapy Start Vitamin D 800 units daily and calcium supplement

## 2015-05-25 DIAGNOSIS — R0602 Shortness of breath: Secondary | ICD-10-CM | POA: Insufficient documentation

## 2015-05-25 DIAGNOSIS — E222 Syndrome of inappropriate secretion of antidiuretic hormone: Secondary | ICD-10-CM | POA: Insufficient documentation

## 2015-05-29 ENCOUNTER — Telehealth: Payer: Self-pay

## 2015-05-29 ENCOUNTER — Encounter: Payer: Self-pay | Admitting: Family Medicine

## 2015-05-29 LAB — BASIC METABOLIC PANEL
Anion gap, serum: 3
BICARBONATE: 36 — AB
BUN: 26 mg/dL — AB (ref 4–21)
CALCIUM: 8.6 mg/dL
Chloride: 93 mmol/L — AB (ref 98–?)
Creat: 0.6
GFR CALC NON AF AMER: 133
Glucose: 85
OSMOLALITY CALC: 269
Potassium: 4 mmol/L
Sodium: 132 — AB (ref 136–?)

## 2015-05-29 NOTE — Telephone Encounter (Signed)
[facility] called to follow up on a fax that was sent to Dr. Cleta Alberts July 19 th and August 5 th. They have not received a response. I gave them a better fax number. Please look out for fax!

## 2015-06-01 NOTE — Telephone Encounter (Signed)
We received fax. Filled out and placed in fax box.

## 2015-06-03 ENCOUNTER — Non-Acute Institutional Stay (INDEPENDENT_AMBULATORY_CARE_PROVIDER_SITE_OTHER): Payer: Medicare Other | Admitting: Family Medicine

## 2015-06-03 DIAGNOSIS — R109 Unspecified abdominal pain: Secondary | ICD-10-CM

## 2015-06-03 DIAGNOSIS — M8580 Other specified disorders of bone density and structure, unspecified site: Secondary | ICD-10-CM

## 2015-06-03 DIAGNOSIS — I10 Essential (primary) hypertension: Secondary | ICD-10-CM

## 2015-06-03 DIAGNOSIS — F17200 Nicotine dependence, unspecified, uncomplicated: Secondary | ICD-10-CM

## 2015-06-03 DIAGNOSIS — Z720 Tobacco use: Secondary | ICD-10-CM

## 2015-06-03 DIAGNOSIS — J449 Chronic obstructive pulmonary disease, unspecified: Secondary | ICD-10-CM

## 2015-06-03 NOTE — Progress Notes (Signed)
Skilled Nursing Facility Discharge Summary  Patient name: [facility] Medical record number: [id number] Date of birth: January 30, 1941 Age: 74 y.o. Gender: male Date of Admission: 04/29/2015 Date of Discharge: 06/04/2015 Admitting Physician: No admitting provider for patient encounter.  Primary Care Provider: Lucilla Edin, MD  Discharge Diagnoses/Problem List:  COPD Constipation Chronic respiratory failure with hypoxia Abdominal aortic aneurysm Essential hypertension Right flank pain Mild cognitive impairment Osteopenia Tobacco use disorder  Disposition: Discharge to ALF  Discharge Condition: Stable  Discharge Exam:  BP 112/72 mmHg  Pulse 73  Temp(Src) 97.8 F (36.6 C)  Resp 20  Wt 139 lb 12.8 oz (63.413 kg)  (Exam performed 06/03/2015) General: well appearing, nasal canula in place, no distress HEENT: no trauma, pupils equal and round Cardiovascular: Regular rate and rhythm, no murmurs CHest/Respiratory: Normal work of breathing, diminished breath sounds but equal throughout with no wheezing Gastrointestinal: Soft, non-tender, non-distended Extremities: no calf tenderness  Brief Nursing Home Course:  Patient was admitted to [facility] after admission to the hospital for hypoxia and hypotension. While at the SNF, he completed a 7 day course of antibiotics and a 5 day course of prednisone. Symptoms improved and patient continued to improve with physical and occupational therapy. Patietn has been on steroids and is beginning a taper to  daily. Patient continued to remain stable for discharge.   Issues for Follow Up:  1. Right flank pain 2. Constipation 3. Prednisone taper  Discharge Medications:    Medication List       This list is accurate as of: 06/03/15 11:59 PM.  Always use your most recent med list.               albuterol 108 (90 BASE) MCG/ACT inhaler  Commonly known as:  PROVENTIL HFA;VENTOLIN HFA  Inhale 2 puffs into the lungs every 4 (four)  hours as needed for wheezing or shortness of breath. Take every 4 hrs for 3 days then as needed     alum & mag hydroxide-simeth 200-200-20 MG/5ML suspension  Commonly known as:  MAALOX/MYLANTA  Take 15 mLs by mouth every 6 (six) hours as needed for indigestion or heartburn.     antiseptic [place] rinse 0.05 % Liqd solution  Commonly known as:  CPC / CETYLPYRIDINIUM CHLORIDE 0.05%  7 mLs by Mouth Rinse route 2 (two) times daily.     arformoterol 15 MCG/2ML Nebu  Commonly known as:  BROVANA  Take 2 mLs (15 mcg total) by nebulization 2 (two) times daily.     aspirin EC 81 MG tablet  Take 1 tablet (81 mg total) by mouth daily.     bisacodyl 10 MG suppository  Commonly known as:  DULCOLAX  Place 1 suppository (10 mg total) rectally daily as needed for moderate constipation.     budesonide 0.5 MG/2ML nebulizer solution  Commonly known as:  PULMICORT  Take 2 mLs (0.5 mg total) by nebulization daily.     calcitonin (salmon) 200 UNIT/ACT nasal spray  Commonly known as:  MIACALCIN  Place 1 spray into alternate nostrils daily.     calcium-vitamin D 500-400 MG-UNIT per tablet  Commonly known as:  OSCAL-500  Take 2 tablets by mouth daily.     docusate sodium 100 MG capsule  Commonly known as:  COLACE  Take 1 capsule (100 mg total) by mouth 2 (two) times daily.     feeding supplement Liqd  Take 1 Container by mouth 3 (three) times daily between meals.  HYDROcodone-acetaminophen 5-325 MG per tablet  Commonly known as:  NORCO  Take 1 tablet by mouth every 6 (six) hours.     ipratropium-albuterol 0.5-2.5 (3) MG/3ML Soln  Commonly known as:  DUONEB  Take 3 mLs by nebulization every 4 (four) hours as needed (shortness of breath).     Linaclotide 145 MCG Caps capsule  Commonly known as:  LINZESS  Take 1 capsule (145 mcg total) by mouth daily.     metoprolol succinate 100 MG 24 hr tablet  Commonly known as:  TOPROL-XL  Take 1 tablet (100 mg total) by mouth daily. Take with or  immediately following a meal.     morphine 20 MG/ML concentrated solution  Commonly known as:  ROXANOL  Take 0.6 mLs (12 mg total) by mouth every 3 (three) hours as needed for severe pain or shortness of breath.     nicotine 14 mg/24hr patch  Commonly known as:  NICODERM CQ - dosed in mg/24 hours  Place 1 patch (14 mg total) onto the skin daily.     ondansetron 4 MG tablet  Commonly known as:  ZOFRAN  Take 1 tablet (4 mg total) by mouth every 6 (six) hours as needed for nausea.     OXYGEN  Inhale 2 L/min into the lungs continuous.     pantoprazole 40 MG tablet  Commonly known as:  PROTONIX  Take 1 tablet (40 mg total) by mouth daily.     polyethylene glycol packet  Commonly known as:  MIRALAX / GLYCOLAX  Take 17 g by mouth daily.     predniSONE 5 MG tablet  Commonly known as:  DELTASONE  Take 10mg  once on 8/19 Then take 7.5mg  daily from 8/20 to 8/26 Then take 5mg  daily from 8/27 to 9/2 Then take 5 mg every other day beginning 9/3     QUEtiapine 25 MG tablet  Commonly known as:  SEROQUEL  Take 1 tablet (25 mg total) by mouth at bedtime.     senna-docusate 8.6-50 MG per tablet  Commonly known as:  SENOKOT S  Take 2 tablets by mouth daily.     simethicone 80 MG chewable tablet  Commonly known as:  MYLICON  Chew 1 tablet (80 mg total) by mouth every 6 (six) hours as needed for flatulence.        Discharge Instructions: Please refer to Patient Instructions section of EMR for full details.  Patient was counseled important signs and symptoms that should prompt return to medical care, changes in medications, dietary instructions, activity restrictions, and follow up appointments.   Follow-Up Appointments: Follow-up with Dr. Cleta Alberts in 2 weeks  Narda Bonds, MD 06/03/2015, 11:00 PM PGY-3, [facility]

## 2015-06-04 MED ORDER — CALCIUM CARBONATE-VITAMIN D 500-400 MG-UNIT PO TABS: 2.0000 | ORAL_TABLET | Freq: Every day | ORAL | Status: AC

## 2015-06-04 MED ORDER — IPRATROPIUM-ALBUTEROL 0.5-2.5 (3) MG/3ML IN SOLN: 3.0000 mL | RESPIRATORY_TRACT | Status: AC | PRN

## 2015-06-04 MED ORDER — QUETIAPINE FUMARATE 25 MG PO TABS: 25.0000 mg | ORAL_TABLET | Freq: Every day | ORAL | Status: AC

## 2015-06-04 MED ORDER — DOCUSATE SODIUM 100 MG PO CAPS: 100.0000 mg | ORAL_CAPSULE | Freq: Two times a day (BID) | ORAL | Status: DC

## 2015-06-04 MED ORDER — ONDANSETRON HCL 4 MG PO TABS: 4.0000 mg | ORAL_TABLET | Freq: Four times a day (QID) | ORAL | Status: AC | PRN

## 2015-06-04 MED ORDER — METOPROLOL SUCCINATE ER 100 MG PO TB24: 100.0000 mg | ORAL_TABLET | Freq: Every day | ORAL | Status: AC

## 2015-06-04 MED ORDER — CALCITONIN (SALMON) 200 UNIT/ACT NA SOLN: 1.0000 | Freq: Every day | NASAL | Status: AC

## 2015-06-04 MED ORDER — BOOST / RESOURCE BREEZE PO LIQD: 1.0000 | Freq: Three times a day (TID) | ORAL | Status: AC

## 2015-06-04 MED ORDER — BUDESONIDE 0.5 MG/2ML IN SUSP: 0.5000 mg | Freq: Every day | RESPIRATORY_TRACT | Status: AC

## 2015-06-04 MED ORDER — ALBUTEROL SULFATE HFA 108 (90 BASE) MCG/ACT IN AERS: 2.0000 | INHALATION_SPRAY | RESPIRATORY_TRACT | Status: AC | PRN

## 2015-06-04 MED ORDER — NICOTINE 14 MG/24HR TD PT24: 14.0000 mg | MEDICATED_PATCH | Freq: Every day | TRANSDERMAL | Status: AC

## 2015-06-04 MED ORDER — ASPIRIN EC 81 MG PO TBEC: 81.0000 mg | DELAYED_RELEASE_TABLET | Freq: Every day | ORAL | Status: AC

## 2015-06-04 MED ORDER — ARFORMOTEROL TARTRATE 15 MCG/2ML IN NEBU: 15.0000 ug | INHALATION_SOLUTION | Freq: Two times a day (BID) | RESPIRATORY_TRACT | Status: AC

## 2015-06-04 MED ORDER — SIMETHICONE 80 MG PO CHEW: 80.0000 mg | CHEWABLE_TABLET | Freq: Four times a day (QID) | ORAL | Status: AC | PRN

## 2015-06-04 MED ORDER — ALUM & MAG HYDROXIDE-SIMETH 200-200-20 MG/5ML PO SUSP: 15.0000 mL | Freq: Four times a day (QID) | ORAL | Status: AC | PRN

## 2015-06-04 MED ORDER — HYDROCODONE-ACETAMINOPHEN 5-325 MG PO TABS: 1.0000 | ORAL_TABLET | Freq: Four times a day (QID) | ORAL | Status: AC

## 2015-06-04 MED ORDER — PANTOPRAZOLE SODIUM 40 MG PO TBEC: 40.0000 mg | DELAYED_RELEASE_TABLET | Freq: Every day | ORAL | Status: AC

## 2015-06-04 MED ORDER — POLYETHYLENE GLYCOL 3350 17 G PO PACK: 17.0000 g | PACK | Freq: Every day | ORAL | Status: DC

## 2015-06-04 MED ORDER — PREDNISONE 5 MG PO TABS: ORAL_TABLET | ORAL | Status: AC

## 2015-06-04 MED ORDER — BISACODYL 10 MG RE SUPP: 10.0000 mg | Freq: Every day | RECTAL | Status: AC | PRN

## 2015-06-04 MED ORDER — SENNOSIDES-DOCUSATE SODIUM 8.6-50 MG PO TABS: 2.0000 | ORAL_TABLET | Freq: Every day | ORAL | Status: AC

## 2015-06-04 MED ORDER — LINACLOTIDE 145 MCG PO CAPS: 145.0000 ug | ORAL_CAPSULE | Freq: Every day | ORAL | Status: AC

## 2015-06-04 MED ORDER — MORPHINE SULFATE (CONCENTRATE) 20 MG/ML PO SOLN: 12.0000 mg | ORAL | Status: AC | PRN

## 2015-06-17 ENCOUNTER — Other Ambulatory Visit: Payer: Self-pay | Admitting: *Deleted

## 2015-06-17 DIAGNOSIS — K5900 Constipation, unspecified: Secondary | ICD-10-CM

## 2015-06-17 MED ORDER — DOCUSATE SODIUM 100 MG PO CAPS: 100.0000 mg | ORAL_CAPSULE | Freq: Two times a day (BID) | ORAL | Status: AC

## 2015-06-22 ENCOUNTER — Encounter (HOSPITAL_COMMUNITY): Payer: Self-pay | Admitting: Emergency Medicine

## 2015-06-22 ENCOUNTER — Emergency Department (HOSPITAL_COMMUNITY)
Admission: EM | Admit: 2015-06-22 | Discharge: 2015-06-22 | Disposition: A | Discharge status: Home / Self Care | Payer: Medicare Other | Attending: Emergency Medicine | Admitting: Emergency Medicine

## 2015-06-22 DIAGNOSIS — K5900 Constipation, unspecified: Secondary | ICD-10-CM | POA: Diagnosis present

## 2015-06-22 DIAGNOSIS — K5901 Slow transit constipation: Secondary | ICD-10-CM | POA: Diagnosis not present

## 2015-06-22 DIAGNOSIS — I10 Essential (primary) hypertension: Secondary | ICD-10-CM | POA: Diagnosis not present

## 2015-06-22 DIAGNOSIS — Z7951 Long term (current) use of inhaled steroids: Secondary | ICD-10-CM | POA: Diagnosis not present

## 2015-06-22 DIAGNOSIS — Z720 Tobacco use: Secondary | ICD-10-CM | POA: Diagnosis not present

## 2015-06-22 DIAGNOSIS — J449 Chronic obstructive pulmonary disease, unspecified: Secondary | ICD-10-CM | POA: Insufficient documentation

## 2015-06-22 DIAGNOSIS — Z8639 Personal history of other endocrine, nutritional and metabolic disease: Secondary | ICD-10-CM | POA: Diagnosis not present

## 2015-06-22 DIAGNOSIS — Z79899 Other long term (current) drug therapy: Secondary | ICD-10-CM | POA: Insufficient documentation

## 2015-06-22 DIAGNOSIS — Z7982 Long term (current) use of aspirin: Secondary | ICD-10-CM | POA: Diagnosis not present

## 2015-06-22 DIAGNOSIS — Z9861 Coronary angioplasty status: Secondary | ICD-10-CM | POA: Insufficient documentation

## 2015-06-22 MED ORDER — POLYETHYLENE GLYCOL 3350 17 GM/SCOOP PO POWD: ORAL | Status: AC

## 2015-06-22 NOTE — ED Notes (Signed)
Bed: [facility] Expected date:  Expected time:  Means of arrival:  Comments: EMS- elderly, abdominal pain/constipation

## 2015-06-22 NOTE — ED Provider Notes (Signed)
CSN: [id number]     Arrival date & time 06/22/15  1301 History   First MD Initiated Contact with Patient 06/22/15 1505     Chief Complaint  Patient presents with  . Constipation     (Consider location/radiation/quality/duration/timing/severity/associated sxs/prior Treatment) Patient is a 74 y.o. male presenting with constipation. The history is provided by the patient.  Constipation Severity:  Moderate Time since last bowel movement:  7 days Timing:  Constant Progression:  Unchanged Chronicity:  New Stool description:  Small Relieved by:  Nothing Worsened by:  Nothing tried Ineffective treatments:  Miralax Associated symptoms: no abdominal pain, no diarrhea, no hematochezia and no vomiting   Risk factors: no change in medication     Past Medical History  Diagnosis Date  . Hypertension   . High cholesterol   . Hyponatremia syndrome   . Chronic cor pulmonale   . COPD (chronic obstructive pulmonary disease)   . Aortic aneurysm   . Chronic respiratory failure with hypoxia 03/01/2015  . Acute on chronic respiratory failure, unspecified whether with hypoxia or hypercapnia   . Acute on chronic respiratory failure 04/25/2015  . Arterial hypotension   . Bilateral flank pain   . Bilateral lower extremity edema 03/01/2013  . CN (constipation) 03/11/2015  . COLD (chronic obstructive lung disease)   . Debility 03/01/2015  . Essential hypertension, benign 09/09/2012  . Hyponatremia 02/28/2015  . Other and unspecified hyperlipidemia 09/09/2012  . Protein-calorie malnutrition, severe 03/12/2015  . Psychosis 03/28/2015  . Syncope and collapse 09/09/2012   Past Surgical History  Procedure Laterality Date  . Angioplasty    . Left heart catheterization with coronary angiogram N/A 10/23/2012    Procedure: LEFT HEART CATHETERIZATION WITH CORONARY ANGIOGRAM;  Surgeon: [name], MD;  Location: [facility] CATH LAB;  Service: Cardiovascular;  Laterality: N/A;  . Abdominal angiogram N/A 10/23/2012   Procedure: ABDOMINAL ANGIOGRAM;  Surgeon: [name], MD;  Location: [facility] CATH LAB;  Service: Cardiovascular;  Laterality: N/A;   No family history on file. Social History  Substance Use Topics  . 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 Smokeless tobacco: Never Used  . Alcohol Use: No    Review of Systems  Gastrointestinal: Positive for constipation. Negative for vomiting, abdominal pain, diarrhea and hematochezia.  All other systems reviewed and are negative.     Allergies  Review of patient's allergies indicates no known allergies.  Home Medications   Prior to Admission medications   Medication Sig Start Date End Date Taking? Authorizing Provider  albuterol (PROVENTIL HFA;VENTOLIN HFA) 108 (90 BASE) MCG/ACT inhaler Inhale 2 puffs into the lungs every 4 (four) hours as needed for wheezing or shortness of breath. Take every 4 hrs for 3 days then as needed 06/04/15  Yes Narda Bonds, MD  alendronate (FOSAMAX) 10 MG tablet Take 10 mg by mouth every morning. Take with a full glass of water on an empty stomach.   Yes Historical Provider, MD  alum & mag hydroxide-simeth (MAALOX/MYLANTA) 200-200-20 MG/5ML suspension Take 15 mLs by mouth every 6 (six) hours as needed for indigestion or heartburn. 06/04/15  Yes Narda Bonds, MD  arformoterol (BROVANA) 15 MCG/2ML NEBU Take 2 mLs (15 mcg total) by nebulization 2 (two) times daily. 06/04/15  Yes Narda Bonds, MD  aspirin EC 81 MG tablet Take 1 tablet (81 mg total) by mouth daily. 06/04/15  Yes Narda Bonds, MD  bisacodyl (DULCOLAX)  10 MG suppository Place 1 suppository (10 mg total) rectally daily as needed for moderate constipation. 06/04/15  Yes Narda Bonds, MD  budesonide (PULMICORT) 0.5 MG/2ML nebulizer solution Take 2 mLs (0.5 mg total) by nebulization daily. 06/04/15  Yes Narda Bonds, MD  calcitonin, salmon, (MIACALCIN) 200 UNIT/ACT nasal spray Place 1 spray into  alternate nostrils daily. 06/04/15  Yes Narda Bonds, MD  calcium-vitamin D (OSCAL-500) 500-400 MG-UNIT per tablet Take 2 tablets by mouth daily. 06/04/15  Yes Narda Bonds, MD  docusate sodium (COLACE) 100 MG capsule Take 1 capsule (100 mg total) by mouth 2 (two) times daily. 06/17/15  Yes Chelle Jeffery, PA-C  feeding supplement (BOOST / RESOURCE BREEZE) LIQD Take 1 Container by mouth 3 (three) times daily between meals. Patient taking differently: Take 1 Container by mouth daily.  06/04/15  Yes Narda Bonds, MD  HYDROcodone-acetaminophen (NORCO) 5-325 MG per tablet Take 1 tablet by mouth every 6 (six) hours. 06/04/15  Yes Narda Bonds, MD  ipratropium-albuterol (DUONEB) 0.5-2.5 (3) MG/3ML SOLN Take 3 mLs by nebulization every 4 (four) hours as needed (shortness of breath). 06/04/15  Yes Narda Bonds, MD  Linaclotide [facility]) 145 MCG CAPS capsule Take 1 capsule (145 mcg total) by mouth daily. 06/04/15  Yes Narda Bonds, MD  metoprolol succinate (TOPROL-XL) 100 MG 24 hr tablet Take 1 tablet (100 mg total) by mouth daily. Take with or immediately following a meal. 06/04/15  Yes Narda Bonds, MD  morphine (ROXANOL) 20 MG/ML concentrated solution Take 0.6 mLs (12 mg total) by mouth every 3 (three) hours as needed for severe pain or shortness of breath. 06/04/15  Yes Narda Bonds, MD  nicotine (NICODERM CQ - DOSED IN MG/24 HOURS) 14 mg/24hr patch Place 1 patch (14 mg total) onto the skin daily. 06/04/15  Yes Narda Bonds, MD  ondansetron (ZOFRAN) 4 MG tablet Take 1 tablet (4 mg total) by mouth every 6 (six) hours as needed for nausea. 06/04/15  Yes Narda Bonds, MD  pantoprazole (PROTONIX) 40 MG tablet Take 1 tablet (40 mg total) by mouth daily. 06/04/15  Yes Narda Bonds, MD  polyethylene glycol (MIRALAX / GLYCOLAX) packet Take 17 g by mouth daily. 06/04/15  Yes Narda Bonds, MD  predniSONE (DELTASONE) 5 MG tablet Take 10mg  once on 8/19 Then take 7.5mg  daily from 8/20 to 8/26 Then take 5mg   daily from 8/27 to 9/2 Then take 5 mg every other day beginning 9/3 Patient taking differently: Take 5 mg by mouth every other day. Take 10mg  once on 8/19 Then take 7.5mg  daily from 8/20 to 8/26 Then take 5mg  daily from 8/27 to 9/2 Then take 5 mg every other day beginning 9/3 06/04/15  Yes Narda Bonds, MD  QUEtiapine (SEROQUEL) 25 MG tablet Take 1 tablet (25 mg total) by mouth at bedtime. 06/04/15  Yes Narda Bonds, MD  senna-docusate (SENOKOT S) 8.6-50 MG per tablet Take 2 tablets by mouth daily. 06/04/15  Yes Narda Bonds, MD  simethicone (MYLICON) 80 MG chewable tablet Chew 1 tablet (80 mg total) by mouth every 6 (six) hours as needed for flatulence. 06/04/15  Yes Narda Bonds, MD  antiseptic oral rinse (CPC / CETYLPYRIDINIUM CHLORIDE 0.05%) 0.05 % LIQD solution 7 mLs by Mouth Rinse route 2 (two) times daily. Patient not taking: Reported on 05/20/2015 03/17/15   Joseph Art, DO  OXYGEN Inhale 2 L/min into the lungs continuous.    Historical  Provider, MD   BP 123/81 mmHg  Pulse 81  Temp(Src) 97.6 F (36.4 C) (Oral)  Resp 18  SpO2 100% Physical Exam  Constitutional: He is oriented to person, place, and time. He appears well-developed and well-nourished. No distress.  HENT:  Head: Normocephalic and atraumatic.  Eyes: Conjunctivae are normal.  Neck: Neck supple. No tracheal deviation present.  Cardiovascular: Normal rate and regular rhythm.   Pulmonary/Chest: Effort normal. No respiratory distress.  Abdominal: Soft. He exhibits no distension. There is no tenderness. There is no rebound and no guarding.  Neurological: He is alert and oriented to person, place, and time.  Skin: Skin is warm and dry.  Psychiatric: He has a normal mood and affect.    ED Course  Procedures (including critical care time) Labs Review Labs Reviewed - No data to display  Imaging Review No results found. I have personally reviewed and evaluated these images and lab results as part of my medical  decision-making.   EKG Interpretation None      MDM   Final diagnoses:  Constipation by delayed colonic transit    74 y.o. male presents with ongoing constipation for the last 7  days without any relief using home remedies. Reassuring abdominal examination. Otherwise well-appearing. Discussed home laxative therapy and enemas to help relieve symptoms. Had 2 bowel movements in ED. Plan to follow up with PCP as needed and return precautions discussed for worsening or new concerning symptoms.     Lyndal Pulley, MD 06/22/15 [phone]

## 2015-06-22 NOTE — ED Notes (Signed)
Patient reports constipation and no bowel movement x 7 days.

## 2015-06-22 NOTE — ED Notes (Signed)
Pt from [facility], c/o constipation x 4 days. Pt has taken miralax and prune juice without relief. Pt has been having BM but "not the way he wants."  A&Ox4. Pt ambulatory. Pt on 2L O2.  Denies abdominal pain, pt c/o back pain.

## 2015-06-22 NOTE — ED Notes (Signed)
[facility] called for transport back to pt's facility ([facility])

## 2015-06-22 NOTE — Discharge Instructions (Signed)

## 2015-06-26 ENCOUNTER — Telehealth: Payer: Self-pay | Admitting: *Deleted

## 2015-06-26 NOTE — Telephone Encounter (Signed)
We received papers to be filled out by Dr. Cleta Alberts.  Per Dr. Cleta Alberts he only seen pt once and sent him and will not be responsible for writing anymore orders and that they should use the provider there.  I notified [facility] and spoke with Erskine Squibb, she will notify the director.

## 2015-07-20 ENCOUNTER — Non-Acute Institutional Stay (INDEPENDENT_AMBULATORY_CARE_PROVIDER_SITE_OTHER): Payer: Medicare Other | Admitting: Family Medicine

## 2015-07-20 DIAGNOSIS — Z602 Problems related to living alone: Secondary | ICD-10-CM

## 2015-07-20 NOTE — Progress Notes (Signed)
Note generated in order to remove patient from [facility] Teaching Service census.

## 2015-07-21 ENCOUNTER — Encounter: Payer: Self-pay | Admitting: Emergency Medicine

## 2015-07-30 ENCOUNTER — Emergency Department (HOSPITAL_COMMUNITY)
Admission: EM | Admit: 2015-07-30 | Discharge: 2015-07-30 | Disposition: A | Discharge status: Home / Self Care | Payer: Medicare Other | Attending: Emergency Medicine | Admitting: Emergency Medicine

## 2015-07-30 ENCOUNTER — Encounter (HOSPITAL_COMMUNITY): Payer: Self-pay | Admitting: Emergency Medicine

## 2015-07-30 ENCOUNTER — Emergency Department (HOSPITAL_COMMUNITY): Payer: Medicare Other

## 2015-07-30 DIAGNOSIS — Z7982 Long term (current) use of aspirin: Secondary | ICD-10-CM | POA: Diagnosis not present

## 2015-07-30 DIAGNOSIS — I10 Essential (primary) hypertension: Secondary | ICD-10-CM | POA: Diagnosis not present

## 2015-07-30 DIAGNOSIS — M47816 Spondylosis without myelopathy or radiculopathy, lumbar region: Secondary | ICD-10-CM | POA: Insufficient documentation

## 2015-07-30 DIAGNOSIS — Z8639 Personal history of other endocrine, nutritional and metabolic disease: Secondary | ICD-10-CM | POA: Insufficient documentation

## 2015-07-30 DIAGNOSIS — Z720 Tobacco use: Secondary | ICD-10-CM | POA: Insufficient documentation

## 2015-07-30 DIAGNOSIS — M545 Low back pain, unspecified: Secondary | ICD-10-CM

## 2015-07-30 DIAGNOSIS — Z79891 Long term (current) use of opiate analgesic: Secondary | ICD-10-CM | POA: Diagnosis not present

## 2015-07-30 DIAGNOSIS — Z8679 Personal history of other diseases of the circulatory system: Secondary | ICD-10-CM | POA: Insufficient documentation

## 2015-07-30 DIAGNOSIS — Z79899 Other long term (current) drug therapy: Secondary | ICD-10-CM | POA: Diagnosis not present

## 2015-07-30 DIAGNOSIS — J449 Chronic obstructive pulmonary disease, unspecified: Secondary | ICD-10-CM | POA: Insufficient documentation

## 2015-07-30 DIAGNOSIS — Z9861 Coronary angioplasty status: Secondary | ICD-10-CM | POA: Diagnosis not present

## 2015-07-30 DIAGNOSIS — Z7951 Long term (current) use of inhaled steroids: Secondary | ICD-10-CM | POA: Diagnosis not present

## 2015-07-30 DIAGNOSIS — F29 Unspecified psychosis not due to a substance or known physiological condition: Secondary | ICD-10-CM | POA: Insufficient documentation

## 2015-07-30 DIAGNOSIS — Z7952 Long term (current) use of systemic steroids: Secondary | ICD-10-CM | POA: Insufficient documentation

## 2015-07-30 MED ORDER — CYCLOBENZAPRINE HCL 10 MG PO TABS
10.0000 mg | ORAL_TABLET | Freq: Once | ORAL | Status: AC
  Administered 2015-07-30: 10 mg via ORAL
  Filled 2015-07-30: qty 1

## 2015-07-30 MED ORDER — OXYCODONE-ACETAMINOPHEN 5-325 MG PO TABS
1.0000 | ORAL_TABLET | Freq: Once | ORAL | Status: AC
  Administered 2015-07-30: 1 via ORAL
  Filled 2015-07-30: qty 1

## 2015-07-30 MED ORDER — HYDROMORPHONE HCL 1 MG/ML IJ SOLN
1.0000 mg | Freq: Once | INTRAMUSCULAR | Status: AC
  Administered 2015-07-30: 1 mg via INTRAMUSCULAR
  Filled 2015-07-30: qty 1

## 2015-07-30 NOTE — ED Notes (Signed)
Called [facility] for transportation back to facility. 

## 2015-07-30 NOTE — ED Notes (Signed)
Spoke to care giver, Shanda BumpsJessica,  [facility], given report . Pt is discharged  to facility .

## 2015-07-30 NOTE — ED Notes (Signed)
Bed: [facility]06 Expected date:  Expected time:  Means of arrival:  Comments: [age] -ems, chronic back pain and med

## 2015-07-30 NOTE — Discharge Instructions (Signed)
Back Exercises °The following exercises strengthen the muscles that help to support the back. They also help to keep the lower back flexible. Doing these exercises can help to prevent back pain or lessen existing pain. °If you have back pain or discomfort, try doing these exercises 2-3 times each day or as told by your health care provider. When the pain goes away, do them once each day, but increase the number of times that you repeat the steps for each exercise (do more repetitions). If you do not have back pain or discomfort, do these exercises once each day or as told by your health care provider. °EXERCISES °Single Knee to Chest °Repeat these steps 3-5 times for each leg: °· Lie on your back on a firm bed or the floor with your legs extended. °· Bring one knee to your chest. Your other leg should stay extended and in contact with the floor. °· Hold your knee in place by grabbing your knee or thigh. °· Pull on your knee until you feel a gentle stretch in your lower back. °· Hold the stretch for 10-30 seconds. °· Slowly release and straighten your leg. °Pelvic Tilt °Repeat these steps 5-10 times: °· Lie on your back on a firm bed or the floor with your legs extended. °· Bend your knees so they are pointing toward the ceiling and your feet are flat on the floor. °· Tighten your lower abdominal muscles to press your lower back against the floor. This motion will tilt your pelvis so your tailbone points up toward the ceiling instead of pointing to your feet or the floor. °· With gentle tension and even breathing, hold this position for 5-10 seconds. °Cat-Cow °Repeat these steps until your lower back becomes more flexible: °· Get into a hands-and-knees position on a firm surface. Keep your hands under your shoulders, and keep your knees under your hips. You may place padding under your knees for comfort. °· Let your head hang down, and point your tailbone toward the floor so your lower back becomes rounded like the  back of a cat. °· Hold this position for 5 seconds. °· Slowly lift your head and point your tailbone up toward the ceiling so your back forms a sagging arch like the back of a cow. °· Hold this position for 5 seconds. °Press-Ups °Repeat these steps 5-10 times: °· Lie on your abdomen (face-down) on the floor. °· Place your palms near your head, about shoulder-width apart. °· While you keep your back as relaxed as possible and keep your hips on the floor, slowly straighten your arms to raise the top half of your body and lift your shoulders. Do not use your back muscles to raise your upper torso. You may adjust the placement of your hands to make yourself more comfortable. °· Hold this position for 5 seconds while you keep your back relaxed. °· Slowly return to lying flat on the floor. °Bridges °Repeat these steps 10 times: °1. Lie on your back on a firm surface. °2. Bend your knees so they are pointing toward the ceiling and your feet are flat on the floor. °3. Tighten your buttocks muscles and lift your buttocks off of the floor until your waist is at almost the same height as your knees. You should feel the muscles working in your buttocks and the back of your thighs. If you do not feel these muscles, slide your feet 1-2 inches farther away from your buttocks. °4. Hold this position for 3-5   seconds. 5. Slowly lower your hips to the starting position, and allow your buttocks muscles to relax completely. If this exercise is too easy, try doing it with your arms crossed over your chest. Abdominal Crunches Repeat these steps 5-10 times: 1. Lie on your back on a firm bed or the floor with your legs extended. 2. Bend your knees so they are pointing toward the ceiling and your feet are flat on the floor. 3. Cross your arms over your chest. 4. Tip your chin slightly toward your chest without bending your neck. 5. Tighten your abdominal muscles and slowly raise your trunk (torso) high enough to lift your shoulder  blades a tiny bit off of the floor. Avoid raising your torso higher than that, because it can put too much stress on your low back and it does not help to strengthen your abdominal muscles. 6. Slowly return to your starting position. Back Lifts Repeat these steps 5-10 times: 1. Lie on your abdomen (face-down) with your arms at your sides, and rest your forehead on the floor. 2. Tighten the muscles in your legs and your buttocks. 3. Slowly lift your chest off of the floor while you keep your hips pressed to the floor. Keep the back of your head in line with the curve in your back. Your eyes should be looking at the floor. 4. Hold this position for 3-5 seconds. 5. Slowly return to your starting position. SEEK MEDICAL CARE IF:  Your back pain or discomfort gets much worse when you do an exercise.  Your back pain or discomfort does not lessen within 2 hours after you exercise. If you have any of these problems, stop doing these exercises right away. Do not do them again unless your health care provider says that you can. SEEK IMMEDIATE MEDICAL CARE IF:  You develop sudden, severe back pain. If this happens, stop doing the exercises right away. Do not do them again unless your health care provider says that you can.   Back Pain, Adult Back pain is very common in adults.The cause of back pain is rarely dangerous and the pain often gets better over time.The cause of your back pain may not be known. Some common causes of back pain include:  Strain of the muscles or ligaments supporting the spine.  Wear and tear (degeneration) of the spinal disks.  Arthritis.  Direct injury to the back. For many people, back pain may return. Since back pain is rarely dangerous, most people can learn to manage this condition on their own. HOME CARE INSTRUCTIONS Watch your back pain for any changes. The following actions may help to lessen any discomfort you are feeling:  Remain active. It is stressful on  your back to sit or stand in one place for long periods of time. Do not sit, drive, or stand in one place for more than 30 minutes at a time. Take short walks on even surfaces as soon as you are able.Try to increase the length of time you walk each day.  Exercise regularly as directed by your health care provider. Exercise helps your back heal faster. It also helps avoid future injury by keeping your muscles strong and flexible.  Do not stay in bed.Resting more than 1-2 days can delay your recovery.  Pay attention to your body when you bend and lift. The most comfortable positions are those that put less stress on your recovering back. Always use proper lifting techniques, including:  Bending your knees.  Keeping the load  close to your body.  Avoiding twisting.  Find a comfortable position to sleep. Use a firm mattress and lie on your side with your knees slightly bent. If you lie on your back, put a pillow under your knees.  Avoid feeling anxious or stressed.Stress increases muscle tension and can worsen back pain.It is important to recognize when you are anxious or stressed and learn ways to manage it, such as with exercise.  Take medicines only as directed by your health care provider. Over-the-counter medicines to reduce pain and inflammation are often the most helpful.Your health care provider may prescribe muscle relaxant drugs.These medicines help dull your pain so you can more quickly return to your normal activities and healthy exercise.  Apply ice to the injured area:  Put ice in a plastic bag.  Place a towel between your skin and the bag.  Leave the ice on for 20 minutes, 2-3 times a day for the first 2-3 days. After that, ice and heat may be alternated to reduce pain and spasms.  Maintain a healthy weight. Excess weight puts extra stress on your back and makes it difficult to maintain good posture. SEEK MEDICAL CARE IF:  You have pain that is not relieved with rest or  medicine.  You have increasing pain going down into the legs or buttocks.  You have pain that does not improve in one week.  You have night pain.  You lose weight.  You have a fever or chills. SEEK IMMEDIATE MEDICAL CARE IF:   You develop new bowel or bladder control problems.  You have unusual weakness or numbness in your arms or legs.  You develop nausea or vomiting.  You develop abdominal pain.  You feel faint.   This information is not intended to replace advice given to you by your health care provider. Make sure you discuss any questions you have with your health care provider.   Document Released: 10/03/2005 Document Revised: 10/24/2014 Document Reviewed: 02/04/2014 Elsevier Interactive Patient Education [employer]  This information is not intended to replace advice given to you by your health care provider. Make sure you discuss any questions you have with your health care provider.   Document Released: 11/10/2004 Document Revised: 06/24/2015 Document Reviewed: 11/27/2014 Elsevier Interactive Patient Education [employer].

## 2015-07-30 NOTE — Progress Notes (Signed)
CSW spoke with patient at bedside. There was no family present. Patient [place] that [place] is [place] [facility]. He states that [facility].  Patient informed CSW that he receives assistance with completing his ADL's. Patient denies falling often. Patient states he has not fallen within the past 6 months. Patient states that he ambulates with a walker.  Patient informed CSW that his primary support is his daughter [name], who lives in [place].  Patient says that he has no questions for CSW.  Trish MageBrittney Treyvonne Tata, LCSWA [phone] ED CSW 07/30/2015 1:21 PM

## 2015-07-30 NOTE — ED Provider Notes (Signed)
CSN: [id number]     Arrival date & time 07/30/15  1036 History   First MD Initiated Contact with Patient 07/30/15 1042     Chief Complaint  Patient presents with  . Back Pain    lower chronic back pain     (Consider location/radiation/quality/duration/timing/severity/associated sxs/prior Treatment) Patient is a 74 y.o. male presenting with back pain.  Back Pain Location:  Lumbar spine Quality:  Aching Radiates to: top of right leg. Pain severity:  Severe Duration:  2 months Timing:  Constant Progression:  Worsening Chronicity:  New Relieved by:  Nothing Worsened by:  Movement (walking with walker) Ineffective treatments:  None tried Associated symptoms: no abdominal pain, no abdominal swelling, no bladder incontinence, no bowel incontinence, no chest pain, no dysuria, no fever, no headaches, no numbness, no perianal numbness and no weakness   Risk factors: no hx of cancer   Risk factors comment:  No falls   Past Medical History  Diagnosis Date  . Hypertension   . High cholesterol   . Hyponatremia syndrome   . Chronic cor pulmonale (HCC)   . COPD (chronic obstructive pulmonary disease) (HCC)   . Aortic aneurysm (HCC)   . Chronic respiratory failure with hypoxia (HCC) 03/01/2015  . Acute on chronic respiratory failure, unspecified whether with hypoxia or hypercapnia (HCC)   . Acute on chronic respiratory failure (HCC) 04/25/2015  . Arterial hypotension   . Bilateral flank pain   . Bilateral lower extremity edema 03/01/2013  . CN (constipation) 03/11/2015  . COLD (chronic obstructive lung disease) (HCC)   . Debility 03/01/2015  . Essential hypertension, benign 09/09/2012  . Hyponatremia 02/28/2015  . Other and unspecified hyperlipidemia 09/09/2012  . Protein-calorie malnutrition, severe (HCC) 03/12/2015  . Psychosis 03/28/2015  . Syncope and collapse 09/09/2012   Past Surgical History  Procedure Laterality Date  . Angioplasty    . Left heart catheterization with coronary  angiogram N/A 10/23/2012    Procedure: LEFT HEART CATHETERIZATION WITH CORONARY ANGIOGRAM;  Surgeon: [name], MD;  Location: [facility] CATH LAB;  Service: Cardiovascular;  Laterality: N/A;  . Abdominal angiogram N/A 10/23/2012    Procedure: ABDOMINAL ANGIOGRAM;  Surgeon: [name], MD;  Location: [facility] CATH LAB;  Service: Cardiovascular;  Laterality: N/A;   No family history on file. Social History  Substance Use Topics  . 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 Smokeless tobacco: Never Used  . Alcohol Use: No    Review of Systems  Constitutional: Negative for fever.  HENT: Negative for sore throat.   Eyes: Negative for visual disturbance.  Respiratory: Negative for shortness of breath.   Cardiovascular: Negative for chest pain.  Gastrointestinal: Negative for abdominal pain and bowel incontinence.  Genitourinary: Negative for bladder incontinence, dysuria and difficulty urinating.  Musculoskeletal: Positive for back pain. Negative for neck stiffness.  Skin: Negative for rash.  Neurological: Negative for syncope, weakness, numbness and headaches.      Allergies  Review of patient's allergies indicates no known allergies.  Home Medications   Prior to Admission medications   Medication Sig Start Date End Date Taking? Authorizing Provider  alendronate (FOSAMAX) 10 MG tablet Take 10 mg by mouth every morning. Take with a full glass of water on an empty stomach.   Yes Historical Provider, MD  aspirin EC 81 MG tablet Take 1 tablet (81 mg total) by mouth daily. 06/04/15  Yes Narda Bonds, MD  calcitonin,  salmon, (MIACALCIN) 200 UNIT/ACT nasal spray Place 1 spray into alternate nostrils daily. 06/04/15  Yes Narda Bonds, MD  calcium-vitamin D (OSCAL-500) 500-400 MG-UNIT per tablet Take 2 tablets by mouth daily. 06/04/15  Yes Narda Bonds, MD  docusate sodium (COLACE) 100 MG capsule Take 1 capsule (100 mg total) by mouth 2  (two) times daily. 06/17/15  Yes Chelle Jeffery, PA-C  feeding supplement (BOOST / RESOURCE BREEZE) LIQD Take 1 Container by mouth 3 (three) times daily between meals. Patient taking differently: Take 1 Container by mouth daily.  06/04/15  Yes Narda Bonds, MD  fentaNYL (DURAGESIC - DOSED MCG/HR) 12 MCG/HR Place 12.5 mcg onto the skin every 3 (three) days.   Yes Historical Provider, MD  Fluticasone-Salmeterol (ADVAIR) 250-50 MCG/DOSE AEPB Inhale 1 puff into the lungs 2 (two) times daily.   Yes Historical Provider, MD  Linaclotide Karlene Einstein) 145 MCG CAPS capsule Take 1 capsule (145 mcg total) by mouth daily. 06/04/15  Yes Narda Bonds, MD  metoprolol succinate (TOPROL-XL) 100 MG 24 hr tablet Take 1 tablet (100 mg total) by mouth daily. Take with or immediately following a meal. 06/04/15  Yes Narda Bonds, MD  mirtazapine (REMERON) 15 MG tablet Take 7.5 mg by mouth at bedtime.   Yes Historical Provider, MD  nicotine (NICODERM CQ - DOSED IN MG/24 HOURS) 14 mg/24hr patch Place 1 patch (14 mg total) onto the skin daily. 06/04/15  Yes Narda Bonds, MD  pantoprazole (PROTONIX) 40 MG tablet Take 1 tablet (40 mg total) by mouth daily. 06/04/15  Yes Narda Bonds, MD  albuterol (PROVENTIL HFA;VENTOLIN HFA) 108 (90 BASE) MCG/ACT inhaler Inhale 2 puffs into the lungs every 4 (four) hours as needed for wheezing or shortness of breath. Take every 4 hrs for 3 days then as needed 06/04/15   Narda Bonds, MD  alum & mag hydroxide-simeth (MAALOX/MYLANTA) 200-200-20 MG/5ML suspension Take 15 mLs by mouth every 6 (six) hours as needed for indigestion or heartburn. 06/04/15   Narda Bonds, MD  antiseptic oral rinse (CPC / CETYLPYRIDINIUM CHLORIDE 0.05%) 0.05 % LIQD solution 7 mLs by Mouth Rinse route 2 (two) times daily. Patient not taking: Reported on 05/20/2015 03/17/15   Joseph Art, DO  arformoterol (BROVANA) 15 MCG/2ML NEBU Take 2 mLs (15 mcg total) by nebulization 2 (two) times daily. 06/04/15   Narda Bonds, MD   bisacodyl (DULCOLAX) 10 MG suppository Place 1 suppository (10 mg total) rectally daily as needed for moderate constipation. 06/04/15   Narda Bonds, MD  budesonide (PULMICORT) 0.5 MG/2ML nebulizer solution Take 2 mLs (0.5 mg total) by nebulization daily. 06/04/15   Narda Bonds, MD  HYDROcodone-acetaminophen (NORCO) 5-325 MG per tablet Take 1 tablet by mouth every 6 (six) hours. 06/04/15   Narda Bonds, MD  ipratropium-albuterol (DUONEB) 0.5-2.5 (3) MG/3ML SOLN Take 3 mLs by nebulization every 4 (four) hours as needed (shortness of breath). 06/04/15   Narda Bonds, MD  morphine (ROXANOL) 20 MG/ML concentrated solution Take 0.6 mLs (12 mg total) by mouth every 3 (three) hours as needed for severe pain or shortness of breath. 06/04/15   Narda Bonds, MD  ondansetron (ZOFRAN) 4 MG tablet Take 1 tablet (4 mg total) by mouth every 6 (six) hours as needed for nausea. 06/04/15   Narda Bonds, MD  OXYGEN Inhale 2 L/min into the lungs continuous.    Historical Provider, MD  polyethylene glycol powder (MIRALAX) powder TAKE 6 CAPFULS  OF MIRALAX IN A 32 OUNCE GATORADE AND DRINK THE WHOLE BEVERAGE FOLLOWED BY 3 CAPFULS TWICE A DAY FOR THE NEXT WEEK AND FOLLOW UP WITH YOUR PRIMARY CARE PHYSICIAN. 06/22/15   Lyndal Pulleyaniel Knott, MD  predniSONE (DELTASONE) 5 MG tablet Take 10mg  once on 8/19 Then take 7.5mg  daily from 8/20 to 8/26 Then take 5mg  daily from 8/27 to 9/2 Then take 5 mg every other day beginning 9/3 Patient taking differently: Take 5 mg by mouth every other day. Take 10mg  once on 8/19 Then take 7.5mg  daily from 8/20 to 8/26 Then take 5mg  daily from 8/27 to 9/2 Then take 5 mg every other day beginning 9/3 06/04/15   Narda Bondsalph A Nettey, MD  QUEtiapine (SEROQUEL) 25 MG tablet Take 1 tablet (25 mg total) by mouth at bedtime. 06/04/15   Narda Bondsalph A Nettey, MD  senna-docusate (SENOKOT S) 8.6-50 MG per tablet Take 2 tablets by mouth daily. 06/04/15   Narda Bondsalph A Nettey, MD  simethicone (MYLICON) 80 MG chewable tablet Chew 1  tablet (80 mg total) by mouth every 6 (six) hours as needed for flatulence. 06/04/15   Narda Bondsalph A Nettey, MD   BP 144/85 mmHg  Pulse 81  Temp(Src) 97.7 F (36.5 C) (Oral)  Resp 22  SpO2 100% Physical Exam  Constitutional: He is oriented to person, place, and time. He appears well-developed and well-nourished. No distress.  HENT:  Head: Normocephalic and atraumatic.  Eyes: Conjunctivae and EOM are normal.  Neck: Normal range of motion.  Cardiovascular: Normal rate, regular rhythm, normal heart sounds and intact distal pulses.  Exam reveals no gallop and no friction rub.   No murmur heard. Pulmonary/Chest: Effort normal and breath sounds normal. No respiratory distress. He has no wheezes. He has no rales.  Abdominal: Soft. He exhibits no distension. There is no tenderness. There is no guarding.  Musculoskeletal: He exhibits no edema.       Lumbar back: He exhibits tenderness and bony tenderness.  Neurological: He is alert and oriented to person, place, and time. He has normal strength. No sensory deficit. GCS eye subscore is 4. GCS verbal subscore is 5. GCS motor subscore is 6.  5/5 bilateral lower extremities  Skin: Skin is warm and dry. He is not diaphoretic.  Nursing note and vitals reviewed.   ED Course  Procedures (including critical care time) Labs Review Labs Reviewed - No data to display  Imaging Review No results found. I have personally reviewed and evaluated these images and lab results as part of my medical decision-making.   EKG Interpretation None      MDM   Final diagnoses:  None   74 year old male with a history of AAA, hypertension, hyperlipidemia, COPD on 2.5 L of oxygen at home, presents with concern of lower back pain for 2 months.  Given patient's abdominal exam is benign, and pain has been present for 2 months of hemodynamically stable patient, and have low suspicion for ruptured AAA. He has no urinary symptoms, no flank pain and doubt pyelonephritis. The  suspicion for other intra-abdominal cause of back pain by history and exam.  Patient has a normal neurologic exam and denies any urinary retention or overflow incontinence, stool incontinence, saddle anesthesia, fever, IV drug use, trauma, chronic steroid use or immunocompromise and have low suspicion suspicion for cauda equina, epidural abscess, or vertebral osteomyelitis.  Given age with concern of possible fx or pathologic fx as cause of pain, XR was obtained which showed anterior wedging of T11, T12, L1, and  L2.  Recommend outpt MRI given duration of symptoms, however do not feel emergent MR is indicated by hx or exam.  Given dilaudid and flexeril for pain.  Patient discharged in stable condition with understanding of reasons to return.   Alvira Monday, MD 07/30/15 [phone]

## 2015-07-30 NOTE — ED Notes (Addendum)
Per [facility] pt from [facility], co chronic lower pain with no radiation , no tingling. Pt denies injury today. Pt has fentanyl patch and taking hydrocodone, last dose was given 1 hour ago. Pt uses walker to ambulate. Pt is alert and oriented x 4. Pt on O2  2 L daily basis.

## 2015-09-06 IMAGING — CR DG CHEST 1V PORT
1 series · 1 of 1 positions shown · non-contrast
Comparison: March 11, 2015.

CLINICAL DATA: Shortness of breath.

EXAM:
PORTABLE CHEST - 1 VIEW

[AP]
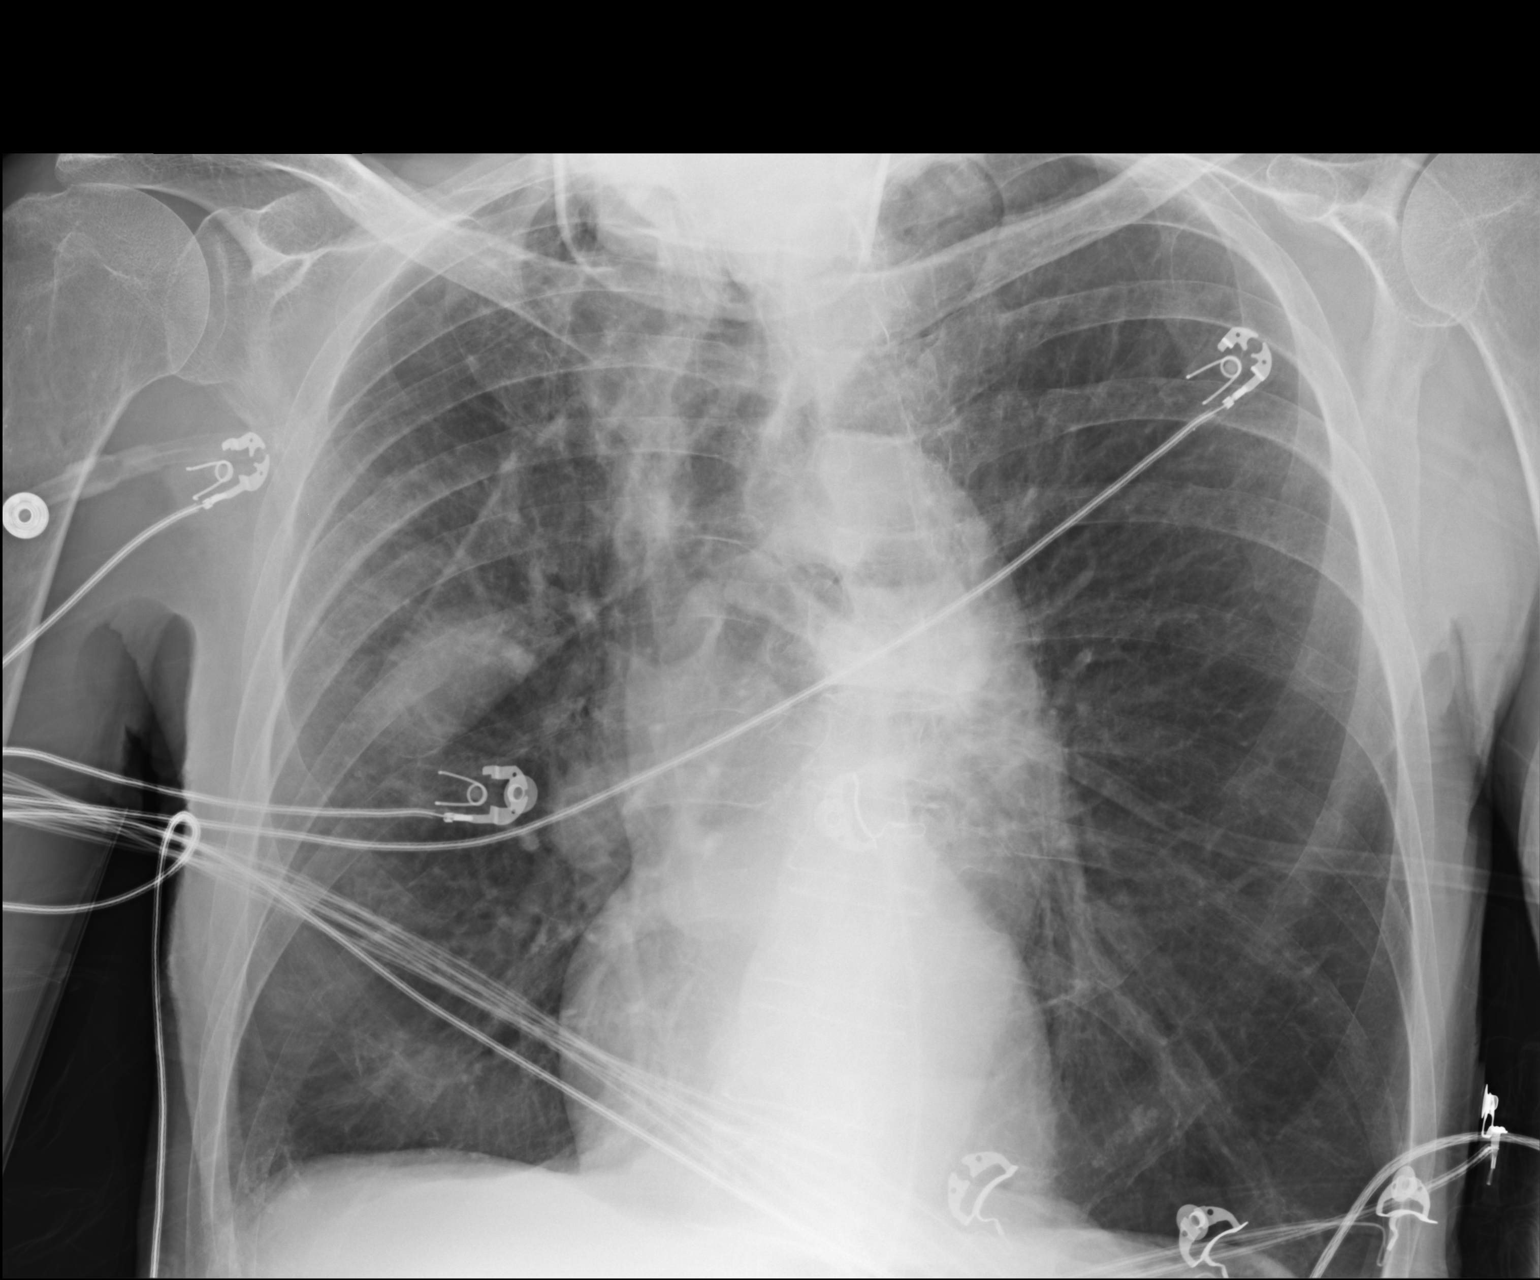

[1 of 1 positions shown; findings below may reference images not displayed]

FINDINGS: The heart size and mediastinal contours are within normal limits. No
pneumothorax or pleural effusion is noted. Bony thorax appears
intact. Left lung is clear. Mild right upper lobe opacity is noted
with overlying external object ; underlying pneumonia cannot be
excluded. The visualized skeletal structures are unremarkable.
IMPRESSION: Mild right upper lobe opacity is noted, but there is an overlying
external object which may be creating this as artifact. However,
focal pneumonia cannot be excluded and repeat radiograph without
overlying external objects is recommended.

## 2015-09-15 ENCOUNTER — Telehealth: Payer: Self-pay

## 2015-09-15 NOTE — Telephone Encounter (Signed)
Pt wants Dr. Cleta Albertsaub to know-he wants to switch his Oxygen provider from [facility] to [facility]. She will need a copy of his CMN. Please advise Angie at [phone] at ext. [phone]

## 2015-09-16 NOTE — Telephone Encounter (Signed)
Please call and see what they need. This was all arranged after his hospitalization. I have not seen him since I put him in the hospital months ago.

## 2015-09-16 NOTE — Telephone Encounter (Signed)
Needs copies of his qualifying oxygen testing notes and orders. Fax number [phone]. He just wants to switch companies. Medical records can we send to them?

## 2015-09-17 NOTE — Telephone Encounter (Signed)
So i don't see any orders or anything from Dr Cleta Albertsaub, or any of our other providers and that is all we are allowed to send so just let me know what she needs and if it is something we can send i would be more than glad to. Other wise looks like it might need to come from one of the other doctors he saw or the nursing home he was at. Thanks!!

## 2015-11-17 ENCOUNTER — Other Ambulatory Visit: Payer: Self-pay

## 2015-11-17 NOTE — Patient Outreach (Signed)
[facility]) Care Management  11/17/2015  JOON POHLE [birth year] [id number]   Telephone Screen  Referral Date: 11/16/15 Referral Source: Next[employer] tier 4 list Referral Reason: 2-admissions, 5-ambulance encounters, 1-ER visit,2-SNF claims, 16-HH, CHF, COPD  Outreach attempt # 1 to patient. Number listed not in service. Call to emergency contact-dtr-Pamela. She stated patient could be reached at [phone]. Call placed to number -male answered and reported patient was not in.   Plan: RN CM will make outreach attempt to patient within a week.  Antionette Fairy, RN,BSN,CCM [facility] Care Management Telephonic Care Management Coordinator Direct Phone: [phone] Toll Free: [phone] Fax: [phone]

## 2015-11-19 ENCOUNTER — Ambulatory Visit: Payer: Self-pay

## 2015-11-19 ENCOUNTER — Other Ambulatory Visit: Payer: Self-pay

## 2015-11-19 NOTE — Patient Outreach (Signed)
[facility]) Care Management  11/19/2015  Colton Owens 1940/12/15 [id number]   Telephone Screen  Referral Date: 11/16/15 Referral Source: NextGen tier 4 list Referral Reason: 2-admissions, 5-ambulance encounters, 1-ER visit,2-SNF claims, 16-HH, CHF, COPD   Outreach attempt # 2 to patient. Male answered and stated patient was not available and hung up the phone.   Plan: RN CM will make outreach attempt to patient within a week.  Antionette Fairy, RN,BSN,CCM [facility] Care Management Telephonic Care Management Coordinator Direct Phone: [phone] Toll Free: [phone] Fax: [phone]

## 2015-11-23 ENCOUNTER — Ambulatory Visit: Payer: Self-pay

## 2015-11-23 ENCOUNTER — Other Ambulatory Visit: Payer: Self-pay

## 2015-11-23 NOTE — Patient Outreach (Signed)
[facility]) Care Management  11/23/2015  LYNCOLN LEDGERWOOD 1940/12/05 [id number]   Telephone Screen  Referral Date: 11/16/15 Referral Source: NextGen tier 4 list Referral Reason: 2-admissions, 5-ambulance encounters, 1-ER visit,2-SNF claims, 16-HH, CHF, COPD  Outreach attempt # 3 to patient. No answer.   Plan: RN CM will mail unsuccessful outreach letter to patient. RN CM will close case if no response from patient in 10 business days.  Antionette Fairy, RN,BSN,CCM [facility] Care Management Telephonic Care Management Coordinator Direct Phone: [phone] Toll Free: [phone] Fax: [phone]

## 2015-12-07 ENCOUNTER — Other Ambulatory Visit: Payer: Self-pay

## 2015-12-07 NOTE — Patient Outreach (Signed)
[facility]) Care Management  12/07/2015  Colton Owens [birth year] [id number]  Telephone Screen  Referral Date: 11/16/15 Referral Source: NextGen tier 4 list Referral Reason: 2-admissions, 5-ambulance encounters, 1-ER visit,2-SNF claims, 16-HH, CHF, COPD   Multiple outreach attempts to reach patient. No response from letter mailed to patient. Case is being closed at this time.   Plan: RN CM will notify administrative assistant of case closure.   Antionette Fairy, RN,BSN,CCM [facility] Care Management Telephonic Care Management Coordinator Direct Phone: [phone] Toll Free: [phone] Fax: [phone]

## 2015-12-11 IMAGING — CR DG LUMBAR SPINE COMPLETE 4+V
5 series · 5 of 5 positions shown · non-contrast
Comparison: CT abdomen and pelvis with bony reformats March 11, 2015

CLINICAL DATA: Lumbago.  Patient denies recent trauma

EXAM:
LUMBAR SPINE - COMPLETE 4+ VIEW

[t lumbar spine ap]
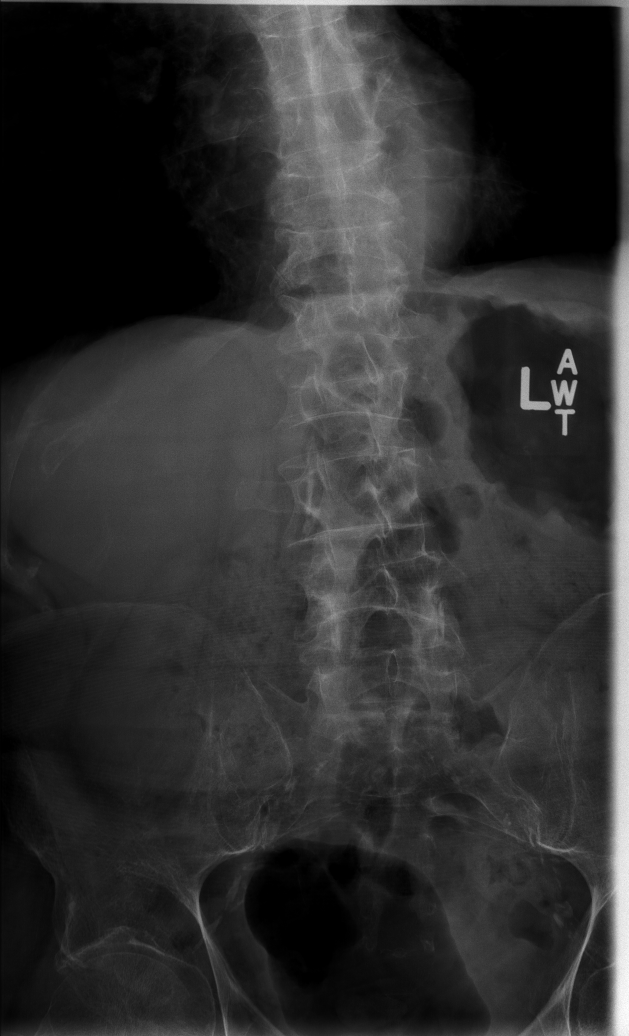

[t lumbar spine obl (1 of 2)]
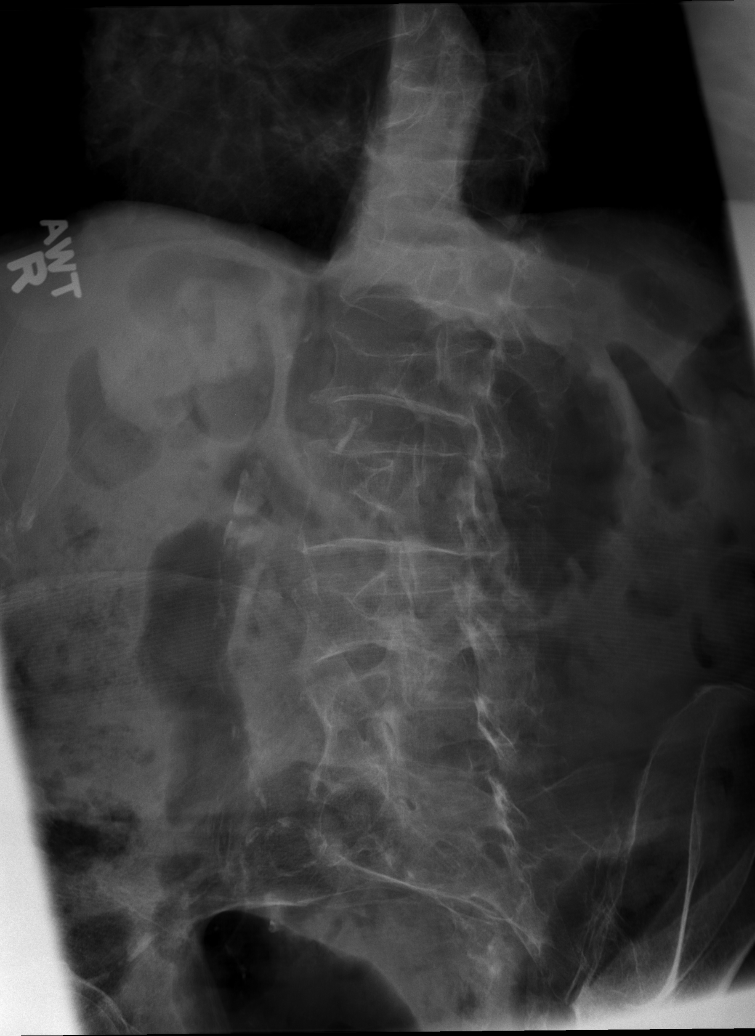

[t lumbar spine obl (2 of 2)]
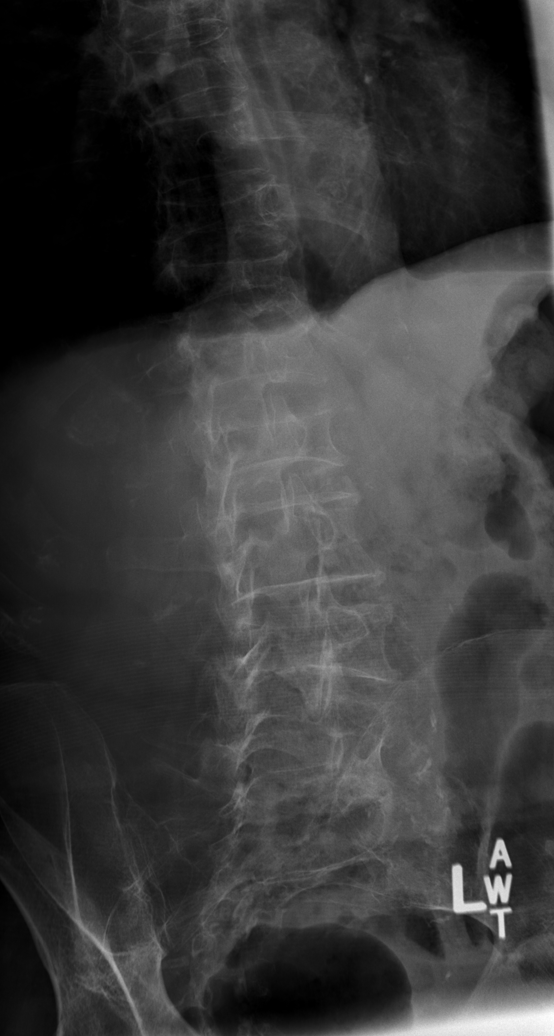

[t lumbar spine lat]
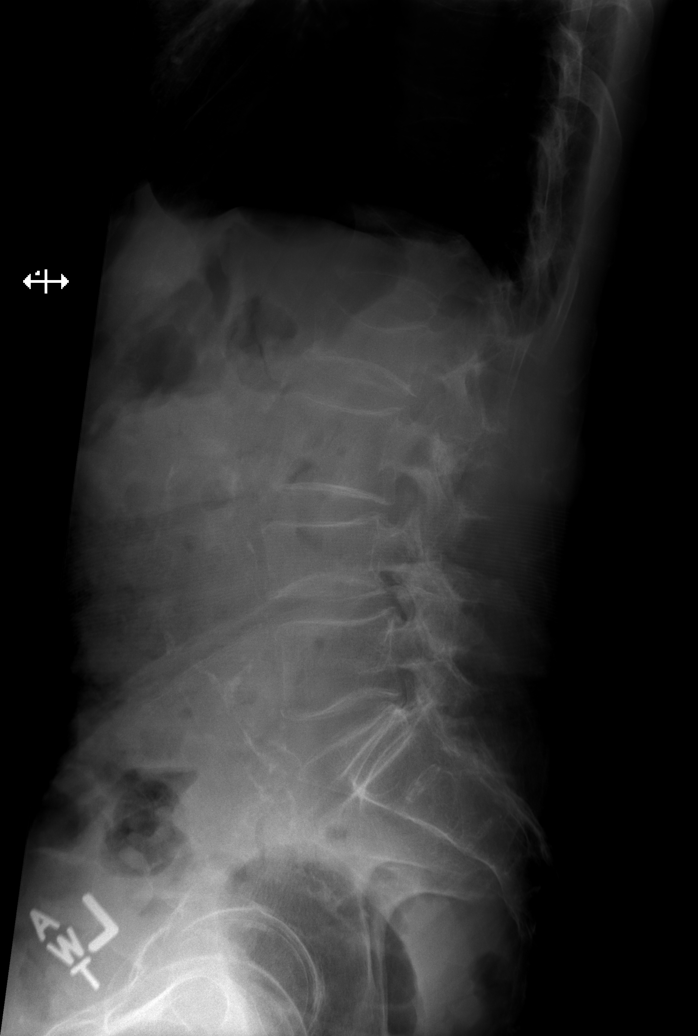

[t lumbar l-5 s-1 spot]
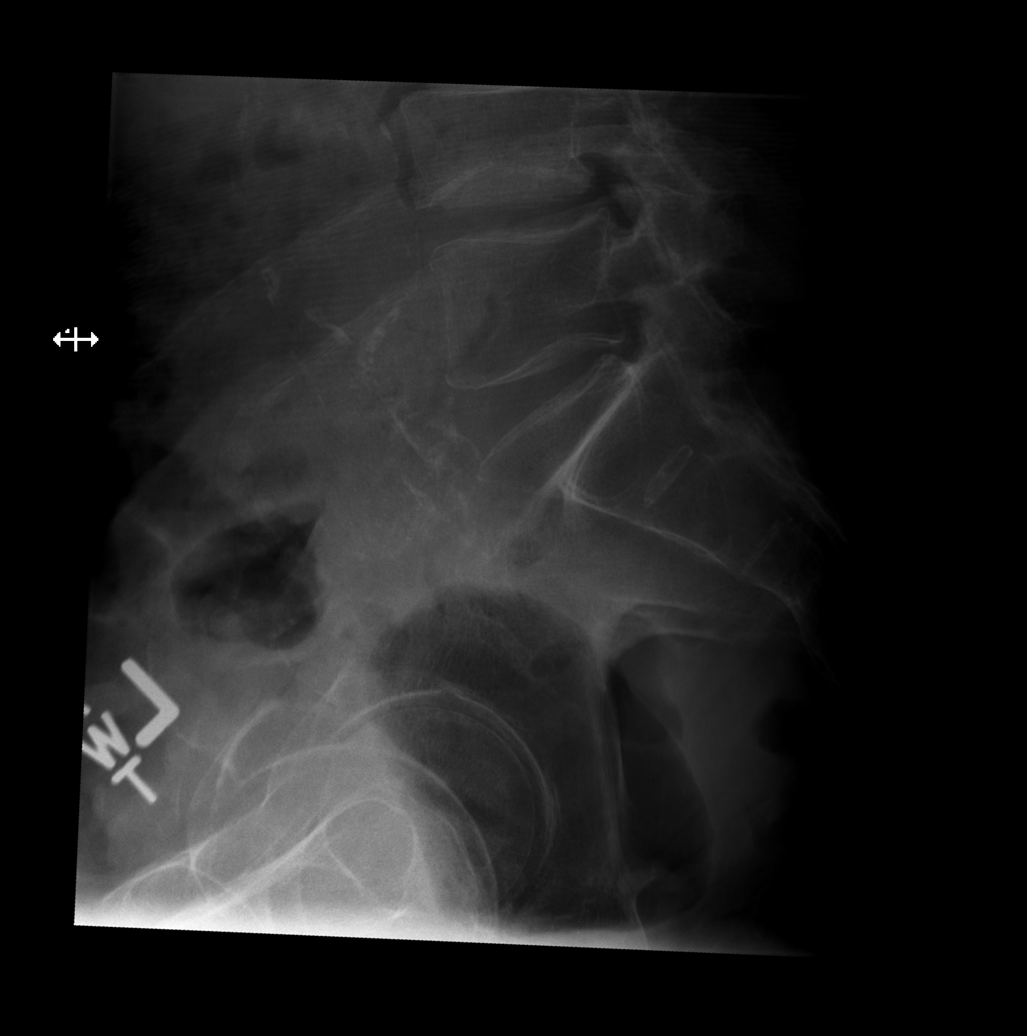

[5 of 5 positions shown; findings below may reference images not displayed]

FINDINGS: Frontal, lateral, spot lumbosacral lateral, and bilateral oblique
views were obtained. There are 5 non-rib-bearing lumbar type
vertebral bodies. There is anterior wedging at T11, T12, L1, and L2,
findings that were not appreciable on prior study from February 2015.
There is minimal anterolisthesis of L4 on L5, not present
previously. There is no appreciable disc space narrowing. There is
facet osteoarthritic change to varying degrees at all levels. There
is atherosclerotic calcification in the aorta. There is widening of
the distal aorta. Bones appear osteoporotic.
IMPRESSION: Anterior wedging at T11, T12, L1, and L2, findings not present on
prior study. Minimal spondylolisthesis at L4-5. No other
spondylolisthesis appreciable. Multilevel osteoarthritic change.
Multiple foci of atherosclerotic change in the aorta. There is
widening of the distal aorta consistent with distal abdominal aortic
aneurysm. Distal abdominal aortic aneurysm has been noted on prior
CT.

## 2020-09-27 ENCOUNTER — Emergency Department (HOSPITAL_COMMUNITY)
Admission: EM | Admit: 2020-09-27 | Discharge: 2020-09-28 | Disposition: A | Discharge status: Other Acute Inpatient Hospital | Payer: Medicare Other | Attending: Emergency Medicine | Admitting: Emergency Medicine

## 2020-09-27 ENCOUNTER — Other Ambulatory Visit: Payer: Self-pay

## 2020-09-27 ENCOUNTER — Emergency Department (HOSPITAL_COMMUNITY): Payer: Medicare Other

## 2020-09-27 ENCOUNTER — Encounter (HOSPITAL_COMMUNITY): Payer: Self-pay | Admitting: Emergency Medicine

## 2020-09-27 DIAGNOSIS — Z79899 Other long term (current) drug therapy: Secondary | ICD-10-CM | POA: Insufficient documentation

## 2020-09-27 DIAGNOSIS — Z955 Presence of coronary angioplasty implant and graft: Secondary | ICD-10-CM | POA: Diagnosis not present

## 2020-09-27 DIAGNOSIS — I10 Essential (primary) hypertension: Secondary | ICD-10-CM | POA: Insufficient documentation

## 2020-09-27 DIAGNOSIS — J449 Chronic obstructive pulmonary disease, unspecified: Secondary | ICD-10-CM | POA: Insufficient documentation

## 2020-09-27 DIAGNOSIS — Z7982 Long term (current) use of aspirin: Secondary | ICD-10-CM | POA: Insufficient documentation

## 2020-09-27 DIAGNOSIS — K5900 Constipation, unspecified: Secondary | ICD-10-CM | POA: Insufficient documentation

## 2020-09-27 DIAGNOSIS — Z7951 Long term (current) use of inhaled steroids: Secondary | ICD-10-CM | POA: Insufficient documentation

## 2020-09-27 DIAGNOSIS — F17210 Nicotine dependence, cigarettes, uncomplicated: Secondary | ICD-10-CM | POA: Insufficient documentation

## 2020-09-27 LAB — COMPREHENSIVE METABOLIC PANEL
ALT: 15 U/L (ref 0–44)
AST: 26 U/L (ref 15–41)
Albumin: 3.8 g/dL (ref 3.5–5.0)
Alkaline Phosphatase: 65 U/L (ref 38–126)
Anion gap: 16 — ABNORMAL HIGH (ref 5–15)
BUN: 26 mg/dL — ABNORMAL HIGH (ref 8–23)
CO2: 26 mmol/L (ref 22–32)
Calcium: 9.2 mg/dL (ref 8.9–10.3)
Chloride: 95 mmol/L — ABNORMAL LOW (ref 98–111)
Creatinine, Ser: 1.05 mg/dL (ref 0.61–1.24)
GFR, Estimated: >60 mL/min (ref >60)
Glucose, Bld: 102 mg/dL — ABNORMAL HIGH (ref 70–99)
Potassium: 4.2 mmol/L (ref 3.5–5.1)
Sodium: 137 mmol/L (ref 135–145)
Total Bilirubin: 1 mg/dL (ref 0.3–1.2)
Total Protein: 7.8 g/dL (ref 6.5–8.1)

## 2020-09-27 LAB — CBC WITH DIFFERENTIAL/PLATELET
Abs Immature Granulocytes: 0.04 10*3/uL (ref 0.00–0.07)
Basophils Absolute: 0.1 10*3/uL (ref 0.0–0.1)
Basophils Relative: 1 %
Eosinophils Absolute: 0.1 10*3/uL (ref 0.0–0.5)
Eosinophils Relative: 1 %
HCT: 45.2 % (ref 39.0–52.0)
Hemoglobin: 14.1 g/dL (ref 13.0–17.0)
Immature Granulocytes: 0 %
Lymphocytes Relative: 14 %
Lymphs Abs: 1.4 10*3/uL (ref 0.7–4.0)
MCH: 30.9 pg (ref 26.0–34.0)
MCHC: 31.2 g/dL (ref 30.0–36.0)
MCV: 99.1 fL (ref 80.0–100.0)
Monocytes Absolute: 0.9 10*3/uL (ref 0.1–1.0)
Monocytes Relative: 9 %
Neutro Abs: 7.5 10*3/uL (ref 1.7–7.7)
Neutrophils Relative %: 75 %
Platelets: 447 10*3/uL — ABNORMAL HIGH (ref 150–400)
RBC: 4.56 MIL/uL (ref 4.22–5.81)
RDW: 12 % (ref 11.5–15.5)
WBC: 10 10*3/uL (ref 4.0–10.5)
nRBC: 0 % (ref 0.0–0.2)

## 2020-09-27 LAB — URINALYSIS, ROUTINE W REFLEX MICROSCOPIC
Bacteria, UA: NONE SEEN
Bilirubin Urine: NEGATIVE
Glucose, UA: NEGATIVE mg/dL
Hgb urine dipstick: NEGATIVE
Ketones, ur: 20 mg/dL — AB
Leukocytes,Ua: NEGATIVE
Nitrite: NEGATIVE
Protein, ur: 30 mg/dL — AB
Specific Gravity, Urine: 1.02 (ref 1.005–1.030)
pH: 5 (ref 5.0–8.0)

## 2020-09-27 LAB — LIPASE, BLOOD: Lipase: 26 U/L (ref 11–51)

## 2020-09-27 MED ORDER — SODIUM CHLORIDE 0.9 % IV BOLUS
500.0000 mL | Freq: Once | INTRAVENOUS | Status: AC
  Administered 2020-09-27: 500 mL via INTRAVENOUS

## 2020-09-27 MED ORDER — DOCUSATE SODIUM 100 MG PO CAPS: 100.0000 mg | ORAL_CAPSULE | Freq: Two times a day (BID) | ORAL | 0 refills | Status: AC

## 2020-09-27 MED ORDER — FLEET ENEMA 7-19 GM/118ML RE ENEM
1.0000 | ENEMA | Freq: Once | RECTAL | Status: AC
  Administered 2020-09-27: 1 via RECTAL
  Filled 2020-09-27: qty 1

## 2020-09-27 NOTE — ED Triage Notes (Signed)
Pt reports constipation x 4 days. Reports that had milk of mag last night and no relief. Today had little liquid in brief but when got to bathroom, couldn't have a BM.

## 2020-09-27 NOTE — ED Notes (Signed)
[facility] called for transport.  

## 2020-09-27 NOTE — ED Provider Notes (Signed)
[facility]-EMERGENCY DEPT Provider Note   CSN: [id number] Arrival date & time: 09/27/20  0932     History Chief Complaint  Patient presents with  . Constipation    Colton Owens is a 79 y.o. male.  78 year old male with prior medical history as detailed below presents for evaluation of reported constipation.  Patient reports 3 to 4 days of constipation.  Patient reports feeling like there is a hard stool that he cannot pass.  Patient denies abdominal pain or fever.  Patient denies nausea or vomiting.  He denies other complaint.  He attempted treatment with milk of magnesia yesterday without improvement in his symptoms.  He denies prior abdominal surgery.  The history is provided by the patient and medical records.  Constipation Severity:  Mild Time since last bowel movement:  4 days Timing:  Rare Progression:  Waxing and waning Chronicity:  Recurrent Stool description:  Large and hard Relieved by:  Nothing Worsened by:  Nothing      Past Medical History:  Diagnosis Date  . Acute on chronic respiratory failure (HCC) 04/25/2015  . Acute on chronic respiratory failure, unspecified whether with hypoxia or hypercapnia (HCC)   . Aortic aneurysm (HCC)   . Arterial hypotension   . Bilateral flank pain   . Bilateral lower extremity edema 03/01/2013  . Chronic cor pulmonale (HCC)   . Chronic respiratory failure with hypoxia (HCC) 03/01/2015  . CN (constipation) 03/11/2015  . COLD (chronic obstructive lung disease) (HCC)   . COPD (chronic obstructive pulmonary disease) (HCC)   . Debility 03/01/2015  . Essential hypertension, benign 09/09/2012  . High cholesterol   . Hypertension   . Hyponatremia 02/28/2015  . Hyponatremia syndrome   . Other and unspecified hyperlipidemia 09/09/2012  . Protein-calorie malnutrition, severe (HCC) 03/12/2015  . Psychosis (HCC) 03/28/2015  . Syncope and collapse 09/09/2012    Patient Active Problem List   Diagnosis Date Noted   . Shortness of breath   . SIADH (syndrome of inappropriate ADH production) (HCC)   . Osteopenia determined by x-ray 05/20/2015  . Right flank pain 05/20/2015  . Mild cognitive impairment 05/20/2015  . Lives alone without help available 05/20/2015  . Tobacco use disorder 05/20/2015  . COLD (chronic obstructive lung disease) (HCC)   . Protein-calorie malnutrition, severe (HCC) 03/12/2015  . CN (constipation) 03/11/2015  . Chronic respiratory failure with hypoxia (HCC) 03/01/2015  . COPD (chronic obstructive pulmonary disease) (HCC) 03/01/2015  . Debility 03/01/2015  . Hyponatremia 02/28/2015  . Essential hypertension, benign 09/09/2012  . Other and unspecified hyperlipidemia 09/09/2012  . Abdominal aortic aneurysm (HCC) 08/29/2012    Past Surgical History:  Procedure Laterality Date  . ABDOMINAL ANGIOGRAM N/A 10/23/2012   Procedure: ABDOMINAL ANGIOGRAM;  Surgeon: [name], MD;  Location: [facility] CATH LAB;  Service: Cardiovascular;  Laterality: N/A;  . ANGIOPLASTY    . LEFT HEART CATHETERIZATION WITH CORONARY ANGIOGRAM N/A 10/23/2012   Procedure: LEFT HEART CATHETERIZATION WITH CORONARY ANGIOGRAM;  Surgeon: [name], MD;  Location: [facility] CATH LAB;  Service: Cardiovascular;  Laterality: N/A;       No family history on file.  Social History   Tobacco Use  . Smoking status: Current Every Day Smoker    Packs/day: 1.00    Years: 58.00    Pack years: 58.00    Types: Cigarettes  . Smokeless tobacco: Never Used  Substance Use Topics  . Alcohol use: No    Alcohol/week: 0.0 standard drinks  . Drug  use: No    Home Medications Prior to Admission medications   Medication Sig Start Date End Date Taking? Authorizing Provider  albuterol (PROVENTIL HFA;VENTOLIN HFA) 108 (90 BASE) MCG/ACT inhaler Inhale 2 puffs into the lungs every 4 (four) hours as needed for wheezing or shortness of breath. Take every 4 hrs for 3 days then as needed 06/04/15   Narda Bonds, MD  alendronate  (FOSAMAX) 10 MG tablet Take 10 mg by mouth every morning. Take with a full glass of water on an empty stomach.    [provider]  alum & mag hydroxide-simeth (MAALOX/MYLANTA) 200-200-20 MG/5ML suspension Take 15 mLs by mouth every 6 (six) hours as needed for indigestion or heartburn. 06/04/15   Narda Bonds, MD  antiseptic oral rinse (CPC / CETYLPYRIDINIUM CHLORIDE 0.05%) 0.05 % LIQD solution 7 mLs by Mouth Rinse route 2 (two) times daily. Patient not taking: Reported on 05/20/2015 03/17/15   Joseph Art, DO  arformoterol (BROVANA) 15 MCG/2ML NEBU Take 2 mLs (15 mcg total) by nebulization 2 (two) times daily. Patient not taking: Reported on 07/30/2015 06/04/15   Narda Bonds, MD  aspirin EC 81 MG tablet Take 1 tablet (81 mg total) by mouth daily. 06/04/15   Narda Bonds, MD  bisacodyl (DULCOLAX) 10 MG suppository Place 1 suppository (10 mg total) rectally daily as needed for moderate constipation. 06/04/15   Narda Bonds, MD  budesonide (PULMICORT) 0.5 MG/2ML nebulizer solution Take 2 mLs (0.5 mg total) by nebulization daily. Patient not taking: Reported on 07/30/2015 06/04/15   Narda Bonds, MD  calcitonin, salmon, (MIACALCIN) 200 UNIT/ACT nasal spray Place 1 spray into alternate nostrils daily. 06/04/15   Narda Bonds, MD  calcium-vitamin D (OSCAL-500) 500-400 MG-UNIT per tablet Take 2 tablets by mouth daily. 06/04/15   Narda Bonds, MD  Cholecalciferol (VITAMIN D3) 2000 UNITS capsule Take 2,000 Units by mouth daily.    [provider]  docusate sodium (COLACE) 100 MG capsule Take 1 capsule (100 mg total) by mouth 2 (two) times daily. 06/17/15   Porfirio Oar, PA  feeding supplement (BOOST / RESOURCE BREEZE) LIQD Take 1 Container by mouth 3 (three) times daily between meals. Patient taking differently: Take 1 Container by mouth daily.  06/04/15   Narda Bonds, MD  fentaNYL (DURAGESIC - DOSED MCG/HR) 12 MCG/HR Place 12.5 mcg onto the skin every 3 (three) days.     [provider]  Fluticasone-Salmeterol (ADVAIR) 250-50 MCG/DOSE AEPB Inhale 1 puff into the lungs 2 (two) times daily.    [provider]  HYDROcodone-acetaminophen (NORCO) 5-325 MG per tablet Take 1 tablet by mouth every 6 (six) hours. 06/04/15   Narda Bonds, MD  ipratropium-albuterol (DUONEB) 0.5-2.5 (3) MG/3ML SOLN Take 3 mLs by nebulization every 4 (four) hours as needed (shortness of breath). 06/04/15   Narda Bonds, MD  Linaclotide Karlene Einstein) 145 MCG CAPS capsule Take 1 capsule (145 mcg total) by mouth daily. 06/04/15   Narda Bonds, MD  metoprolol succinate (TOPROL-XL) 100 MG 24 hr tablet Take 1 tablet (100 mg total) by mouth daily. Take with or immediately following a meal. 06/04/15   Narda Bonds, MD  mirtazapine (REMERON) 15 MG tablet Take 7.5 mg by mouth at bedtime.    [provider]  morphine (ROXANOL) 20 MG/ML concentrated solution Take 0.6 mLs (12 mg total) by mouth every 3 (three) hours as needed for severe pain or shortness of breath. Patient not taking: Reported  on 07/30/2015 06/04/15   Narda Bonds, MD  nicotine (NICODERM CQ - DOSED IN MG/24 HOURS) 14 mg/24hr patch Place 1 patch (14 mg total) onto the skin daily. 06/04/15   Narda Bonds, MD  ondansetron (ZOFRAN) 4 MG tablet Take 1 tablet (4 mg total) by mouth every 6 (six) hours as needed for nausea. 06/04/15   Narda Bonds, MD  OXYGEN Inhale 2 L/min into the lungs continuous.    [provider]  pantoprazole (PROTONIX) 40 MG tablet Take 1 tablet (40 mg total) by mouth daily. 06/04/15   Narda Bonds, MD  polyethylene glycol powder (MIRALAX) powder TAKE 6 CAPFULS OF MIRALAX IN A 32 OUNCE GATORADE AND DRINK THE WHOLE BEVERAGE FOLLOWED BY 3 CAPFULS TWICE A DAY FOR THE NEXT WEEK AND FOLLOW UP WITH YOUR PRIMARY CARE PHYSICIAN. Patient not taking: Reported on 07/30/2015 06/22/15   Lyndal Pulley, MD  predniSONE (DELTASONE) 5 MG tablet Take 10mg  once on 8/19 Then take 7.5mg  daily from 8/20  to 8/26 Then take 5mg  daily from 8/27 to 9/2 Then take 5 mg every other day beginning 9/3 Patient taking differently: Take 5 mg by mouth every other day. Take 10mg  once on 8/19 Then take 7.5mg  daily from 8/20 to 8/26 Then take 5mg  daily from 8/27 to 9/2 Then take 5 mg every other day beginning 9/3 06/04/15   9/27, MD  QUEtiapine (SEROQUEL) 25 MG tablet Take 1 tablet (25 mg total) by mouth at bedtime. 06/04/15   11/3, MD  senna-docusate (SENOKOT S) 8.6-50 MG per tablet Take 2 tablets by mouth daily. 06/04/15   06/06/15, MD  simethicone (MYLICON) 80 MG chewable tablet Chew 1 tablet (80 mg total) by mouth every 6 (six) hours as needed for flatulence. 06/04/15   01-23-1977, MD  tiotropium (SPIRIVA) 18 MCG inhalation capsule Place 18 mcg into inhaler and inhale daily.    [provider]    Allergies    Patient has no known allergies.  Review of Systems   Review of Systems  Gastrointestinal: Positive for constipation.  All other systems reviewed and are negative.   Physical Exam Updated Vital Signs BP 106/62 (BP Location: Left Arm)   Pulse (!) 108   Temp 98.2 F (36.8 C) (Oral)   Resp 16   SpO2 100%   Physical Exam Vitals and nursing note reviewed.  Constitutional:      General: He is not in acute distress.    Appearance: Normal appearance. He is well-developed and well-nourished.  HENT:     Head: Normocephalic and atraumatic.     Mouth/Throat:     Mouth: Oropharynx is clear and moist.  Eyes:     Extraocular Movements: EOM normal.     Conjunctiva/sclera: Conjunctivae normal.     Pupils: Pupils are equal, round, and reactive to light.  Cardiovascular:     Rate and Rhythm: Normal rate and regular rhythm.     Heart sounds: Normal heart sounds.  Pulmonary:     Effort: Pulmonary effort is normal. No respiratory distress.     Breath sounds: Normal breath sounds.  Abdominal:     General: There is no distension.     Palpations: Abdomen is  soft.     Tenderness: There is no abdominal tenderness.  Genitourinary:    Comments: Rectal exam reveals presence of hard stool in the rectal vault.  Digital disimpaction attempted with small amount of stool removed. Musculoskeletal:  General: No deformity or edema. Normal range of motion.     Cervical back: Normal range of motion and neck supple.  Skin:    General: Skin is warm and dry.  Neurological:     Mental Status: He is alert and oriented to person, place, and time.  Psychiatric:        Mood and Affect: Mood and affect normal.     ED Results / Procedures / Treatments   Labs (all labs ordered are listed, but only abnormal results are displayed) Labs Reviewed  CBC WITH DIFFERENTIAL/PLATELET - Abnormal; Notable for the following components:      Result Value   Platelets 447 (*)    All other components within normal limits  COMPREHENSIVE METABOLIC PANEL - Abnormal; Notable for the following components:   Chloride 95 (*)    Glucose, Bld 102 (*)    BUN 26 (*)    Anion gap 16 (*)    All other components within normal limits  LIPASE, BLOOD  URINALYSIS, ROUTINE W REFLEX MICROSCOPIC    EKG None  Radiology DG Abdomen Acute W/Chest  Result Date: 09/27/2020 CLINICAL DATA:  Constipation for 4 days. EXAM: DG ABDOMEN ACUTE WITH 1 VIEW CHEST COMPARISON:  Radiograph 03/15/2015 FINDINGS: Lungs are hyperinflated. The heart is normal in size. There is aortic atherosclerosis and tortuosity. No pulmonary edema or focal airspace disease. No pleural fluid or pneumothorax. No free intra-abdominal air. Scattered colonic air-fluid levels. Small volume of stool in the rectosigmoid colon, otherwise paucity of colonic stool. No bowel dilatation to suggest obstruction. There are vascular calcifications. No radiopaque calculi. There are multiple thoracic and lumbar compression fractures that are not well assessed on these AP views. The bones are diffusely under mineralized. IMPRESSION: 1. Small  volume of formed stool in the rectosigmoid colon, otherwise paucity of colonic stool. Scattered colonic air-fluid levels. No bowel obstruction. 2. Pulmonary hyperinflation without acute abnormality. Aortic Atherosclerosis (ICD10-I70.0). Electronically Signed   By: Narda RutherfordMelanie  Sanford M.D.   On: 09/27/2020 18:45    Procedures Procedures (including critical care time)  Medications Ordered in ED Medications  sodium chloride 0.9 % bolus 500 mL (has no administration in time range)  sodium phosphate (FLEET) 7-19 GM/118ML enema 1 enema (1 enema Rectal Given 09/27/20 1651)    ED Course  I have reviewed the triage vital signs and the nursing notes.  Pertinent labs & imaging results that were available during my care of the patient were reviewed by me and considered in my medical decision making (see chart for details).    MDM Rules/Calculators/A&P                          MDM  Screen complete  Colton Owens was evaluated in Emergency Department on 09/27/2020 for the symptoms described in the history of present illness. He was evaluated in the context of the global COVID-19 pandemic, which necessitated consideration that the patient might be at risk for infection with the SARS-CoV-2 virus that causes COVID-19. Institutional protocols and algorithms that pertain to the evaluation of patients at risk for COVID-19 are in a state of rapid change based on information released by regulatory bodies including the CDC and federal and state organizations. These policies and algorithms were followed during the patient's care in the ED.  Patient is presenting for reported issues with constipation.  Rectal exam revealed hard stool in the rectal vault.  This was successfully disimpacted by myself without difficulty.  Patient tolerated this procedure well.  Work-up otherwise without evidence of significant abnormality.  Patient taking good p.o. prior to discharge.  Repeat abdominal exam remains benign.  Patient  understands need for close follow-up.  Strict return precautions given and understood.   Final Clinical Impression(s) / ED Diagnoses Final diagnoses:  Constipation, unspecified constipation type    Rx / DC Orders ED Discharge Orders         Ordered    docusate sodium (COLACE) 100 MG capsule  Every 12 hours        09/27/20 2032           Wynetta Fines, MD 09/27/20 2034

## 2020-09-27 NOTE — ED Notes (Signed)
Patient assisted to bedside commode.

## 2020-09-27 NOTE — Discharge Instructions (Addendum)
Please return for any problem.  °

## 2020-09-27 NOTE — ED Notes (Signed)
Patient transported to X-ray 

## 2020-09-27 NOTE — ED Notes (Signed)
Patient attempting to use bedside commode at this time.

## 2020-09-27 NOTE — ED Triage Notes (Signed)
Per [facility] pt from [facility] for constipation x 2 days.

## 2020-09-27 NOTE — ED Notes (Signed)
Patient remains on bedside commode attempting to have a bowel movement. MD made aware. Will reassess vital signs and obtain IV access after patient requests assistance back in bed.

## 2020-09-27 NOTE — ED Notes (Signed)
Liquid in bedside commode but no stool noted.

## 2023-11-18 DEATH — deceased
